# Patient Record
Sex: Female | Born: 1970 | Race: White | Hispanic: No | Smoking: Never smoker
Health system: Southern US, Community
[De-identification: ages and names within clinical notes are randomized; demographics above are authoritative.]

## PROBLEM LIST (undated history)

## (undated) DIAGNOSIS — J45909 Unspecified asthma, uncomplicated: Secondary | ICD-10-CM

## (undated) DIAGNOSIS — J449 Chronic obstructive pulmonary disease, unspecified: Secondary | ICD-10-CM

## (undated) DIAGNOSIS — S8490XA Injury of unspecified nerve at lower leg level, unspecified leg, initial encounter: Secondary | ICD-10-CM

## (undated) DIAGNOSIS — I509 Heart failure, unspecified: Secondary | ICD-10-CM

## (undated) DIAGNOSIS — M069 Rheumatoid arthritis, unspecified: Secondary | ICD-10-CM

## (undated) DIAGNOSIS — M797 Fibromyalgia: Secondary | ICD-10-CM

## (undated) DIAGNOSIS — I10 Essential (primary) hypertension: Secondary | ICD-10-CM

## (undated) DIAGNOSIS — M1990 Unspecified osteoarthritis, unspecified site: Secondary | ICD-10-CM

## (undated) DIAGNOSIS — N390 Urinary tract infection, site not specified: Secondary | ICD-10-CM

## (undated) DIAGNOSIS — J189 Pneumonia, unspecified organism: Secondary | ICD-10-CM

## (undated) DIAGNOSIS — I739 Peripheral vascular disease, unspecified: Secondary | ICD-10-CM

## (undated) DIAGNOSIS — G40409 Other generalized epilepsy and epileptic syndromes, not intractable, without status epilepticus: Secondary | ICD-10-CM

## (undated) DIAGNOSIS — D369 Benign neoplasm, unspecified site: Secondary | ICD-10-CM

## (undated) DIAGNOSIS — R569 Unspecified convulsions: Secondary | ICD-10-CM

## (undated) HISTORY — PX: SHOULDER SURGERY: SHX246

## (undated) HISTORY — PX: BACK SURGERY: SHX140

## (undated) HISTORY — DX: Essential (primary) hypertension: I10

## (undated) HISTORY — PX: TONSILLECTOMY: SUR1361

## (undated) HISTORY — PX: TUBAL LIGATION: SHX77

---

## 1998-02-25 ENCOUNTER — Ambulatory Visit (HOSPITAL_COMMUNITY): Admission: RE | Admit: 1998-02-25 | Discharge: 1998-02-25 | Payer: Self-pay | Admitting: *Deleted

## 1999-07-10 ENCOUNTER — Encounter: Payer: Self-pay | Admitting: Cardiology

## 1999-07-10 ENCOUNTER — Encounter: Admission: RE | Admit: 1999-07-10 | Discharge: 1999-07-10 | Payer: Self-pay | Admitting: Cardiology

## 2001-02-15 ENCOUNTER — Emergency Department (HOSPITAL_COMMUNITY): Admission: EM | Admit: 2001-02-15 | Discharge: 2001-02-15 | Payer: Self-pay | Admitting: Emergency Medicine

## 2001-12-01 ENCOUNTER — Emergency Department (HOSPITAL_COMMUNITY): Admission: EM | Admit: 2001-12-01 | Discharge: 2001-12-01 | Payer: Self-pay | Admitting: Emergency Medicine

## 2001-12-01 ENCOUNTER — Encounter: Payer: Self-pay | Admitting: Emergency Medicine

## 2002-12-06 ENCOUNTER — Emergency Department (HOSPITAL_COMMUNITY): Admission: EM | Admit: 2002-12-06 | Discharge: 2002-12-06 | Payer: Self-pay | Admitting: Emergency Medicine

## 2002-12-06 ENCOUNTER — Encounter: Payer: Self-pay | Admitting: Emergency Medicine

## 2002-12-31 ENCOUNTER — Emergency Department (HOSPITAL_COMMUNITY): Admission: EM | Admit: 2002-12-31 | Discharge: 2002-12-31 | Payer: Self-pay | Admitting: Emergency Medicine

## 2002-12-31 ENCOUNTER — Encounter: Payer: Self-pay | Admitting: *Deleted

## 2003-01-03 ENCOUNTER — Ambulatory Visit (HOSPITAL_COMMUNITY): Admission: RE | Admit: 2003-01-03 | Discharge: 2003-01-03 | Payer: Self-pay | Admitting: *Deleted

## 2003-01-03 ENCOUNTER — Encounter: Payer: Self-pay | Admitting: *Deleted

## 2003-11-01 ENCOUNTER — Emergency Department (HOSPITAL_COMMUNITY): Admission: EM | Admit: 2003-11-01 | Discharge: 2003-11-01 | Payer: Self-pay | Admitting: Emergency Medicine

## 2004-08-02 ENCOUNTER — Emergency Department (HOSPITAL_COMMUNITY): Admission: EM | Admit: 2004-08-02 | Discharge: 2004-08-02 | Payer: Self-pay | Admitting: Emergency Medicine

## 2004-12-25 ENCOUNTER — Encounter: Admission: RE | Admit: 2004-12-25 | Discharge: 2004-12-25 | Payer: Self-pay | Admitting: Family Medicine

## 2005-06-11 ENCOUNTER — Encounter: Admission: RE | Admit: 2005-06-11 | Discharge: 2005-09-09 | Payer: Self-pay | Admitting: Orthopedic Surgery

## 2005-07-28 ENCOUNTER — Ambulatory Visit (HOSPITAL_COMMUNITY): Admission: RE | Admit: 2005-07-28 | Discharge: 2005-07-28 | Payer: Self-pay | Admitting: Orthopedic Surgery

## 2006-05-05 ENCOUNTER — Emergency Department (HOSPITAL_COMMUNITY): Admission: EM | Admit: 2006-05-05 | Discharge: 2006-05-06 | Payer: Self-pay | Admitting: Emergency Medicine

## 2006-05-06 ENCOUNTER — Observation Stay (HOSPITAL_COMMUNITY): Admission: EM | Admit: 2006-05-06 | Discharge: 2006-05-08 | Payer: Self-pay | Admitting: Emergency Medicine

## 2006-06-03 ENCOUNTER — Emergency Department (HOSPITAL_COMMUNITY): Admission: EM | Admit: 2006-06-03 | Discharge: 2006-06-03 | Payer: Self-pay | Admitting: Emergency Medicine

## 2006-06-04 ENCOUNTER — Emergency Department (HOSPITAL_COMMUNITY): Admission: EM | Admit: 2006-06-04 | Discharge: 2006-06-04 | Payer: Self-pay | Admitting: Family Medicine

## 2006-09-12 ENCOUNTER — Ambulatory Visit (HOSPITAL_COMMUNITY): Admission: RE | Admit: 2006-09-12 | Discharge: 2006-09-12 | Payer: Self-pay | Admitting: Orthopedic Surgery

## 2006-09-18 ENCOUNTER — Ambulatory Visit (HOSPITAL_COMMUNITY): Admission: RE | Admit: 2006-09-18 | Discharge: 2006-09-19 | Payer: Self-pay | Admitting: Orthopaedic Surgery

## 2007-02-27 ENCOUNTER — Ambulatory Visit: Admission: RE | Admit: 2007-02-27 | Discharge: 2007-02-27 | Payer: Self-pay | Admitting: Orthopaedic Surgery

## 2007-03-15 ENCOUNTER — Emergency Department (HOSPITAL_COMMUNITY): Admission: EM | Admit: 2007-03-15 | Discharge: 2007-03-16 | Payer: Self-pay | Admitting: Emergency Medicine

## 2007-03-17 ENCOUNTER — Inpatient Hospital Stay (HOSPITAL_COMMUNITY): Admission: RE | Admit: 2007-03-17 | Discharge: 2007-03-21 | Payer: Self-pay | Admitting: Orthopaedic Surgery

## 2008-03-25 ENCOUNTER — Emergency Department (HOSPITAL_COMMUNITY): Admission: EM | Admit: 2008-03-25 | Discharge: 2008-03-25 | Payer: Self-pay | Admitting: Emergency Medicine

## 2008-07-08 ENCOUNTER — Ambulatory Visit: Payer: Self-pay | Admitting: Family Medicine

## 2008-08-06 ENCOUNTER — Ambulatory Visit (HOSPITAL_COMMUNITY): Admission: RE | Admit: 2008-08-06 | Discharge: 2008-08-06 | Payer: Self-pay | Admitting: Unknown Physician Specialty

## 2009-01-22 ENCOUNTER — Observation Stay (HOSPITAL_COMMUNITY): Admission: EM | Admit: 2009-01-22 | Discharge: 2009-01-22 | Payer: Self-pay | Admitting: Emergency Medicine

## 2009-03-17 ENCOUNTER — Emergency Department (HOSPITAL_COMMUNITY): Admission: EM | Admit: 2009-03-17 | Discharge: 2009-03-17 | Payer: Self-pay | Admitting: Family Medicine

## 2009-05-03 ENCOUNTER — Emergency Department (HOSPITAL_COMMUNITY): Admission: EM | Admit: 2009-05-03 | Discharge: 2009-05-03 | Payer: Self-pay | Admitting: Emergency Medicine

## 2009-05-31 ENCOUNTER — Ambulatory Visit: Payer: Self-pay | Admitting: Family Medicine

## 2009-05-31 LAB — CONVERTED CEMR LAB
ALT: 19 units/L (ref 0–35)
AST: 19 units/L (ref 0–37)
Albumin: 4.1 g/dL (ref 3.5–5.2)
Alkaline Phosphatase: 55 units/L (ref 39–117)
Anti Nuclear Antibody(ANA): NEGATIVE
BUN: 8 mg/dL (ref 6–23)
Basophils Absolute: 0 10*3/uL (ref 0.0–0.1)
Basophils Relative: 0 % (ref 0–1)
CO2: 24 meq/L (ref 19–32)
Calcium: 9.3 mg/dL (ref 8.4–10.5)
Chloride: 106 meq/L (ref 96–112)
Creatinine, Ser: 0.76 mg/dL (ref 0.40–1.20)
Eosinophils Absolute: 0.1 10*3/uL (ref 0.0–0.7)
Eosinophils Relative: 2 % (ref 0–5)
Free T4: 1.11 ng/dL (ref 0.80–1.80)
Glucose, Bld: 97 mg/dL (ref 70–99)
HCT: 39.1 % (ref 36.0–46.0)
Hemoglobin: 12.1 g/dL (ref 12.0–15.0)
Lymphocytes Relative: 33 % (ref 12–46)
Lymphs Abs: 2.4 10*3/uL (ref 0.7–4.0)
MCHC: 30.9 g/dL (ref 30.0–36.0)
MCV: 85.4 fL (ref 78.0–100.0)
Monocytes Absolute: 0.7 10*3/uL (ref 0.1–1.0)
Monocytes Relative: 10 % (ref 3–12)
Neutro Abs: 4.1 10*3/uL (ref 1.7–7.7)
Neutrophils Relative %: 56 % (ref 43–77)
Platelets: 327 10*3/uL (ref 150–400)
Potassium: 4.1 meq/L (ref 3.5–5.3)
RBC: 4.58 M/uL (ref 3.87–5.11)
RDW: 14.1 % (ref 11.5–15.5)
Rhuematoid fact SerPl-aCnc: <20 intl units/mL (ref 0–20)
Sed Rate: 29 mm/hr — ABNORMAL HIGH (ref 0–22)
Sodium: 140 meq/L (ref 135–145)
TSH: 1.482 microintl units/mL (ref 0.350–4.500)
Total Bilirubin: 0.8 mg/dL (ref 0.3–1.2)
Total Protein: 7.2 g/dL (ref 6.0–8.3)
Vit D, 25-Hydroxy: 13 ng/mL — ABNORMAL LOW (ref 30–89)
WBC: 7.3 10*3/uL (ref 4.0–10.5)

## 2009-06-05 ENCOUNTER — Emergency Department (HOSPITAL_COMMUNITY): Admission: EM | Admit: 2009-06-05 | Discharge: 2009-06-05 | Payer: Self-pay | Admitting: Emergency Medicine

## 2009-11-28 ENCOUNTER — Emergency Department (HOSPITAL_COMMUNITY)
Admission: EM | Admit: 2009-11-28 | Discharge: 2009-11-28 | Payer: Self-pay | Source: Home / Self Care | Admitting: Emergency Medicine

## 2010-03-28 ENCOUNTER — Ambulatory Visit (HOSPITAL_COMMUNITY)
Admission: RE | Admit: 2010-03-28 | Discharge: 2010-03-28 | Payer: Self-pay | Source: Home / Self Care | Attending: Family Medicine | Admitting: Family Medicine

## 2010-06-01 LAB — URINALYSIS, ROUTINE W REFLEX MICROSCOPIC
Bilirubin Urine: NEGATIVE
Glucose, UA: NEGATIVE mg/dL
Hgb urine dipstick: NEGATIVE
Ketones, ur: NEGATIVE mg/dL
Nitrite: NEGATIVE
Protein, ur: NEGATIVE mg/dL
Specific Gravity, Urine: 1.01 (ref 1.005–1.030)
Urobilinogen, UA: 1 mg/dL (ref 0.0–1.0)
pH: 7.5 (ref 5.0–8.0)

## 2010-06-01 LAB — POCT PREGNANCY, URINE: Preg Test, Ur: NEGATIVE

## 2010-06-07 LAB — URINALYSIS, ROUTINE W REFLEX MICROSCOPIC
Bilirubin Urine: NEGATIVE
Glucose, UA: NEGATIVE mg/dL
Hgb urine dipstick: NEGATIVE
Ketones, ur: NEGATIVE mg/dL
Nitrite: NEGATIVE
Protein, ur: NEGATIVE mg/dL
Specific Gravity, Urine: 1.008 (ref 1.005–1.030)
Urobilinogen, UA: 0.2 mg/dL (ref 0.0–1.0)
pH: 8.5 — ABNORMAL HIGH (ref 5.0–8.0)

## 2010-06-07 LAB — RAPID URINE DRUG SCREEN, HOSP PERFORMED
Amphetamines: NOT DETECTED
Barbiturates: NOT DETECTED
Benzodiazepines: NOT DETECTED
Cocaine: NOT DETECTED
Opiates: NOT DETECTED
Tetrahydrocannabinol: NOT DETECTED

## 2010-06-07 LAB — DIFFERENTIAL
Basophils Absolute: 0.2 10*3/uL — ABNORMAL HIGH (ref 0.0–0.1)
Basophils Relative: 2 % — ABNORMAL HIGH (ref 0–1)
Eosinophils Absolute: 0 10*3/uL (ref 0.0–0.7)
Eosinophils Relative: 0 % (ref 0–5)
Lymphocytes Relative: 18 % (ref 12–46)
Lymphs Abs: 1.8 10*3/uL (ref 0.7–4.0)
Monocytes Absolute: 0.5 10*3/uL (ref 0.1–1.0)
Monocytes Relative: 5 % (ref 3–12)
Neutro Abs: 7.6 10*3/uL (ref 1.7–7.7)
Neutrophils Relative %: 75 % (ref 43–77)

## 2010-06-07 LAB — ETHANOL: Alcohol, Ethyl (B): <5 mg/dL (ref 0–10)

## 2010-06-07 LAB — D-DIMER, QUANTITATIVE: D-Dimer, Quant: <0.22 ug/mL-FEU (ref 0.00–0.48)

## 2010-06-07 LAB — CBC
HCT: 36.7 % (ref 36.0–46.0)
Hemoglobin: 12.6 g/dL (ref 12.0–15.0)
MCHC: 34.4 g/dL (ref 30.0–36.0)
MCV: 82.5 fL (ref 78.0–100.0)
Platelets: 275 10*3/uL (ref 150–400)
RBC: 4.45 MIL/uL (ref 3.87–5.11)
RDW: 13.6 % (ref 11.5–15.5)
WBC: 10.1 10*3/uL (ref 4.0–10.5)

## 2010-06-07 LAB — PREGNANCY, URINE: Preg Test, Ur: NEGATIVE

## 2010-06-11 LAB — CBC
HCT: 38 % (ref 36.0–46.0)
Hemoglobin: 12.6 g/dL (ref 12.0–15.0)
MCHC: 33.1 g/dL (ref 30.0–36.0)
MCV: 82.9 fL (ref 78.0–100.0)
Platelets: 331 10*3/uL (ref 150–400)
RBC: 4.59 MIL/uL (ref 3.87–5.11)
RDW: 13.6 % (ref 11.5–15.5)
WBC: 9 10*3/uL (ref 4.0–10.5)

## 2010-06-11 LAB — BASIC METABOLIC PANEL
BUN: 8 mg/dL (ref 6–23)
CO2: 23 mEq/L (ref 19–32)
Calcium: 9.5 mg/dL (ref 8.4–10.5)
Chloride: 109 mEq/L (ref 96–112)
Creatinine, Ser: 0.77 mg/dL (ref 0.4–1.2)
GFR calc Af Amer: >60 mL/min (ref >60)
GFR calc non Af Amer: >60 mL/min (ref >60)
Glucose, Bld: 122 mg/dL — ABNORMAL HIGH (ref 70–99)
Potassium: 3.6 mEq/L (ref 3.5–5.1)
Sodium: 139 mEq/L (ref 135–145)

## 2010-06-11 LAB — D-DIMER, QUANTITATIVE: D-Dimer, Quant: 0.28 ug/mL-FEU (ref 0.00–0.48)

## 2010-06-21 LAB — URINALYSIS, ROUTINE W REFLEX MICROSCOPIC
Bilirubin Urine: NEGATIVE
Glucose, UA: NEGATIVE mg/dL
Hgb urine dipstick: NEGATIVE
Ketones, ur: NEGATIVE mg/dL
Nitrite: NEGATIVE
Protein, ur: NEGATIVE mg/dL
Specific Gravity, Urine: 1.005 (ref 1.005–1.030)
Urobilinogen, UA: 0.2 mg/dL (ref 0.0–1.0)
pH: 7.5 (ref 5.0–8.0)

## 2010-06-21 LAB — DIFFERENTIAL
Basophils Absolute: 0 10*3/uL (ref 0.0–0.1)
Basophils Relative: 0 % (ref 0–1)
Eosinophils Absolute: 0 10*3/uL (ref 0.0–0.7)
Eosinophils Relative: 0 % (ref 0–5)
Lymphocytes Relative: 7 % — ABNORMAL LOW (ref 12–46)
Lymphs Abs: 0.7 10*3/uL (ref 0.7–4.0)
Monocytes Absolute: 0.5 10*3/uL (ref 0.1–1.0)
Monocytes Relative: 4 % (ref 3–12)
Neutro Abs: 10.1 10*3/uL — ABNORMAL HIGH (ref 1.7–7.7)
Neutrophils Relative %: 88 % — ABNORMAL HIGH (ref 43–77)

## 2010-06-21 LAB — GLUCOSE, CAPILLARY: Glucose-Capillary: 148 mg/dL — ABNORMAL HIGH (ref 70–99)

## 2010-06-21 LAB — POCT I-STAT, CHEM 8
BUN: 7 mg/dL (ref 6–23)
Calcium, Ion: 1.18 mmol/L (ref 1.12–1.32)
Chloride: 107 mEq/L (ref 96–112)
Creatinine, Ser: 0.8 mg/dL (ref 0.4–1.2)
Glucose, Bld: 151 mg/dL — ABNORMAL HIGH (ref 70–99)
HCT: 42 % (ref 36.0–46.0)
Hemoglobin: 14.3 g/dL (ref 12.0–15.0)
Potassium: 4 mEq/L (ref 3.5–5.1)
Sodium: 140 mEq/L (ref 135–145)
TCO2: 19 mmol/L (ref 0–100)

## 2010-06-21 LAB — URINE MICROSCOPIC-ADD ON

## 2010-06-21 LAB — CBC
HCT: 39.5 % (ref 36.0–46.0)
Hemoglobin: 13.7 g/dL (ref 12.0–15.0)
MCHC: 34.6 g/dL (ref 30.0–36.0)
MCV: 82.6 fL (ref 78.0–100.0)
Platelets: 246 10*3/uL (ref 150–400)
RBC: 4.78 MIL/uL (ref 3.87–5.11)
RDW: 13.5 % (ref 11.5–15.5)
WBC: 11.4 10*3/uL — ABNORMAL HIGH (ref 4.0–10.5)

## 2010-06-21 LAB — POCT PREGNANCY, URINE: Preg Test, Ur: NEGATIVE

## 2010-06-21 LAB — D-DIMER, QUANTITATIVE: D-Dimer, Quant: 0.51 ug/mL-FEU — ABNORMAL HIGH (ref 0.00–0.48)

## 2010-07-03 LAB — DIFFERENTIAL
Basophils Absolute: 0 10*3/uL (ref 0.0–0.1)
Basophils Relative: 0 % (ref 0–1)
Eosinophils Absolute: 0 10*3/uL (ref 0.0–0.7)
Eosinophils Relative: 0 % (ref 0–5)
Lymphocytes Relative: 5 % — ABNORMAL LOW (ref 12–46)
Lymphs Abs: 0.8 10*3/uL (ref 0.7–4.0)
Monocytes Absolute: 0.7 10*3/uL (ref 0.1–1.0)
Monocytes Relative: 5 % (ref 3–12)
Neutro Abs: 13.1 10*3/uL — ABNORMAL HIGH (ref 1.7–7.7)
Neutrophils Relative %: 89 % — ABNORMAL HIGH (ref 43–77)

## 2010-07-03 LAB — POCT I-STAT, CHEM 8
BUN: 8 mg/dL (ref 6–23)
Calcium, Ion: 1.16 mmol/L (ref 1.12–1.32)
Chloride: 109 mEq/L (ref 96–112)
Creatinine, Ser: 0.8 mg/dL (ref 0.4–1.2)
Glucose, Bld: 115 mg/dL — ABNORMAL HIGH (ref 70–99)
HCT: 39 % (ref 36.0–46.0)
Hemoglobin: 13.3 g/dL (ref 12.0–15.0)
Potassium: 3.7 mEq/L (ref 3.5–5.1)
Sodium: 141 mEq/L (ref 135–145)
TCO2: 20 mmol/L (ref 0–100)

## 2010-07-03 LAB — URINALYSIS, ROUTINE W REFLEX MICROSCOPIC
Bilirubin Urine: NEGATIVE
Glucose, UA: NEGATIVE mg/dL
Hgb urine dipstick: NEGATIVE
Ketones, ur: NEGATIVE mg/dL
Nitrite: NEGATIVE
Protein, ur: NEGATIVE mg/dL
Specific Gravity, Urine: 1.012 (ref 1.005–1.030)
Urobilinogen, UA: 0.2 mg/dL (ref 0.0–1.0)
pH: 7.5 (ref 5.0–8.0)

## 2010-07-03 LAB — URINE CULTURE: Colony Count: >=100000

## 2010-07-03 LAB — CBC
HCT: 37.3 % (ref 36.0–46.0)
Hemoglobin: 12.5 g/dL (ref 12.0–15.0)
MCHC: 33.6 g/dL (ref 30.0–36.0)
MCV: 82.4 fL (ref 78.0–100.0)
Platelets: 282 10*3/uL (ref 150–400)
RBC: 4.52 MIL/uL (ref 3.87–5.11)
RDW: 12.9 % (ref 11.5–15.5)
WBC: 14.7 10*3/uL — ABNORMAL HIGH (ref 4.0–10.5)

## 2010-07-03 LAB — LIPASE, BLOOD: Lipase: 19 U/L (ref 11–59)

## 2010-07-03 LAB — POCT PREGNANCY, URINE: Preg Test, Ur: NEGATIVE

## 2010-08-01 NOTE — Op Note (Signed)
Suzanne Velazquez, Suzanne Velazquez             ACCOUNT NO.:  [id number]   MEDICAL RECORD NO.:  [id number]          PATIENT TYPE:  EMS   LOCATION:  MAJO                         FACILITY:  [facility]   PHYSICIAN:  Mark C. Ophelia Charter, M.D.    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   OPERATION/PROCEDURE:  1. Left L4-5 TLIF bilateral foraminotomy.  2. Redo microdiskectomy.  3. Peek plus local bone.  4. Pedicle bone marrow aspirate.  5. Bilateral transverse process fusion.  6. pedicle instrumentation, rods and screws,  L4-5   ESTIMATED BLOOD LOSS:  300 mL.  Retransfuse 100 mL.   PROCEDURE:  After induction of general anesthesia,  orotracheal  intubation, the patient placed prone on chest rolls.  Back was prepped  and preoperative Ancef was given.  CellSaver was used.   The back was prepped with DuraPrep, area squared with towels, Betadine  Vi-Drape applied and the usual laminectomy sheets and drapes.  Midline  incision was made using the old scar, extending up proximally and  distally where a sterile skin marker been used to mark the midline.  Fascia was divided.  She had cerebellar retractors placed.  All the  adipose tissue apart until the fascia was divided with subperiosteal  dissection out to the facets and then out to the transverse processes at  4 and 5.  Once the transfer processes were prepared using the Bovie  electrocautery, soft tissue removed from the gutters. A Kocher clamp was  placed directly down over the lamina, confirming that this was directly  over the L4-5 space.  Decompression was performed on both sides and  operative microscope was draped and  brought in for microdissection.  The  patient is having more left pain than right.  Had previous disk  herniation on the right at L4-5 and soft tissue was taken down that was  adherent to the dura with microdissection technique.  Bone was removed  out to the level of the pedicle on the left side. Nerve root was gently  mobilized.  Annulus was incised.  Passes were made, removing the disk  material with some large chunks centrally.  Once this was identified,  facet was removed and bone was removed directly out lateral from level  of the disk, just above the pedicle at L5 and the nerve root at L4 was  carefully retracted.  Some small cupping veins were coagulated with the  fine insufflated by bipolar cautery.  Passes were made with straight  pituitaries, angled curettes, straight curettes, the blade cutter, and  box cutter.  A 7 trial followed by 9 was tried and the 9 gave a nice  fit.  The 11 box had been used instead of the 9, initially going in and  as the cage was inserted after bone had been meticulously cleaned  out,  small pieces were spaced anterior to where the cage was sitting for the  interbody fusion.  Cage was inserted.  It was hanging out but would not  advance.  It was removed.  There was some epidural bleeding below the  nerve root.  Again this was stopped, bringing the operative microscope  back in and microscope taken and then cage reinserted.  Again it was not  quite satisfactory position, would not continue to advance and turn.  So  it was taken out again and ring curette was used on the opposite side.  Foramina was enlarged further.  Dura was freed up in the lateral gutter  so it would mobilize easier and then Epstein curette was used and the  midline cage was inserted through the Viper retractor far lateral as  possible and with this initial insertion site starting a little bit more  medial with the dura gently mobilized, the cage this time kicked over as  it should and  was in a transverse position. On AP view it was at the  midline.  Next, the screws were placed with the sequence of starter awl,  the joystick, pedicle feeler, tap, aspiration of the pedicle for bone  marrow using the dull trocar, and taking aspirated bone marrow from the  pedicle and placing on the VITOSS that was then cut into strips on one-  half and left as a __________ on the other side.  On the right L4  pedicle screw, it angled up a little bit.  Did not violate the endplate.  Looked like it started in the middle of the pedicle and it was backed  out and then repositioned. On the second try.  It was placed in good  position parallel with the other screw with good purchase.  All  remaining pieces of bone were then repaired with the VITOSS strips  placed in the lateral gutters.  VITOSS on the TLIF on the left, the  strips and chips of the patient's own bone on the right side after the  transverse processes were burred up with power bur prior to placement of  each pedicle screw.  The 40 mm rods were placed.  All screws were Biomet  Polaris 40 mm length, 6.5 mm diameter screws with 40 mm titanium rods.  Left side was tightened down first, compressed to 105 pounds with the  torque wrench and then the opposite side on the right compressed, locked  down 1-2 mm sticking out on each end of the screws.  A pickup was placed  on the right side of the patient and AP and lateral final spot pictures  were taken showing good position of screws in the pedicle, good position  of the rods and good position of the cage with bilateral bone graft.  The patient was then closed with 0 Vicryl in deep fascia, 2-0 Vicryl on  the subcutaneous tissue, skin closure, postop dressing.  Hemovac was  placed in the subcutaneous tissue.  The patient was retransfused 100 mL  from the CellSaver.  Instrument and needle count, pack count correct.  The TLIF  area and the cage and gutter and the dura was carefully  inspected  prior to closure, making sure no bone graft was sitting in  that position.  The nerve root was free, both L4-L5, and hockey stick  was used to check each nerve root, right and left, and make sure they  were all completely decompressed prior to the instrumentation closure.  The patient was transferred to the recovery room in stable condition.      Mark C. Ophelia Charter, M.D.  Electronically Signed     MCY/MEDQ  D:  03/17/2007  T:  03/18/2007  Job:  [id number]

## 2010-08-01 NOTE — Op Note (Signed)
NAMETALIBAH, COLASURDO             ACCOUNT NO.:  [id number]   MEDICAL RECORD NO.:  [id number]          PATIENT TYPE:  OIB   LOCATION:  5032                         FACILITY:  [facility]   PHYSICIAN:  Mark C. Ophelia Charter, M.D.    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   PROCEDURE:  Right L4-5 microdiskectomy.   SURGEON:  Mark C. Ophelia Charter, M.D.   ASSISTANT:  Wende Neighbors, P.A.-C.   ANESTHESIA:  Plus Marcaine, skin, local.   BLOOD LOSS:  Minimal.   PROCEDURE:  After induction of general anesthesia, the patient was  placed in theAndrews frame with careful padding and positioning.  The  back was prepped with DuraPrep, the area was supported with towels.  Betadine and Vi-Drape was applied.  Laminectomy sheets and drapes were  applied after a sterile skin marker was used to mark placement of the  drapes, where a spinal needle was placed. Cross-table lateral x-ray  showed the needle was just above the 4-5 space.  Incision started at the  level of the needle, extended distally.  Subperiosteal dissection on the  lamina, Taylor retractors placed laterally.  Laminotomy was performed  and Penfield #4 was placed down next to the ligamentum. A second x-ray  confirmed it was at the appropriate level.  Operative microscope was  draped and brought in.  Ligamentum was removed.  Nerve root was dorsally  displaced, and as it was gently teased over there was a large disk  rupture present.  It was sending out pressure from the __________ .  Large chunks of disks were delivered.  Straight-down pituitaries,  micropituitaries were used to perform diskectomy, removing the  fragments.  Foraminotomy was performed.  Nerve root was free.  After  irrigation with saline solution, passes were made anteriorly over the  dura with a hockey stick.  No areas of compression.  Bone was removed  out to the level of the pedicle.  There was mild facet overhang in this  40 year old female.  Nerve root was free.  Dura was then tacked and  Taylor retractor was removed.  Deep fascia was closed with 0 Vicryl, 2-0  in the subcutaneous tissue, 4-0 Vicryl subcuticular, skin closed with  tincture of benzoin and Steri-Strips, postop dressing and tape.  Instrument count and needle count was correct.      Mark C. Ophelia Charter, M.D.  Electronically Signed     MCY/MEDQ  D:  09/18/2006  T:  09/19/2006  Job:  [id number]

## 2010-08-04 NOTE — Discharge Summary (Signed)
Suzanne Velazquez, Suzanne Velazquez             ACCOUNT NO.:  [id number]   MEDICAL RECORD NO.:  [id number]          PATIENT TYPE:  OBV   LOCATION:  4705                         FACILITY:  [facility]   PHYSICIAN:  Mobolaji B. Bakare, M.D.DATE OF BIRTH:  [birth year]   DATE OF ADMISSION:  05/06/2006  DATE OF DISCHARGE:  05/08/2006                               DISCHARGE SUMMARY   PRIMARY CARE PHYSICIAN:  Dr. Candyce Churn. Sanders.   NEUROLOGIST:  Dr. Laural Benes.   FINAL DIAGNOSES:  1. Pyelonephritis.  2. Probable seizure activity.  3. Syncope.   PROCEDURE:  Lumbar spine x-ray showed mild degenerative changes of L4-5  and L5-S1. No evidence of infection. Chest x-ray:  No acute  cardiopulmonary disease. A CT scan showed no intracranial abnormalities.   BRIEF HISTORY:  Please refer to admission H&P dictated by Dr. Hannah Beat for full details.   HOSPITAL COURSE:  1. Syncope versus seizures. The patient could not give adequate      history of what happened. However, as per daughter's history, the      patient passed out while on return from the bathroom. There was no      tonic/clonic convulsion. It is not clear if she had a nonconvulsive      seizure. She had a low blood pressure on arrival to the emergency      room. She was syncopal. This was felt to be orthostatic. The      patient has adequately hydrated. She is no longer syncopal or      orthostatic. The patient will be seeing a neurologist within the      next one week. She was continued on home medications.  2. Acute pyelonephritis. She had a positive UA. The patient was      empirically started on ciprofloxacin. Urine culture came back      __________, nevertheless, given the history of increased urinary      frequency and low-grade fever, she would complete treatment for      pyelonephritis.   DISCHARGE CONDITION:  Stable.   VITALS ON DISCHARGE:  Temperature 98.1, blood pressure 119/75, O2  saturations of 95%, respiratory rate of 18, heart rate  of 79 with a  temperature of 97.8.   DISCHARGE MEDICATIONS:  1. Ciprofloxacin 500 mg daily until April 27, 2006.  2. Topamax 75 mg b.i.d.  3. Fluoxetine 20 mg daily.  4. Lithium continue as before.   FOLLOW UP:  With Dr. Allyne Gee in 1 to 2 weeks and Dr. Laural Benes,  neurologist, in 1 week.      Mobolaji B. Corky Downs, M.D.  Electronically Signed     MBB/MEDQ  D:  05/08/2006  T:  05/08/2006  Job:  [id number]

## 2010-08-04 NOTE — H&P (Signed)
Suzanne Velazquez, NIEHOFF NO.:  [id number]   MEDICAL RECORD NO.:  [id number]          PATIENT TYPE:  INP   LOCATION:  4705                         FACILITY:  [facility]   PHYSICIAN:  Hettie Holstein, D.O.    DATE OF BIRTH:  Jun 27, 1970   DATE OF ADMISSION:  05/06/2006  DATE OF DISCHARGE:                              HISTORY & PHYSICAL   PRIMARY CARE PHYSICIAN:  Unassigned.   CHIEF COMPLAINT:  Passed out.   HISTORY OF PRESENTING ILLNESS:  Suzanne Velazquez is a 40 year old female  with history of petit mal seizures managed by neurologist in [facility] as well as history of bipolar disorder managed by Dr.  Allyne Gee at [facility], who had been in her usual state of  health up until the 17th, when her daughter reports that she observed  her mother going to the bathroom, and as she was coming back, she called  her daughter's name out 5 times and stood stationary staring off into  space, and as she ran up to her, she collapsed to the ground into her  daughter's arms.  There was no loss of bowel or bladder.  She did not  exhibit tonic-clonic movements, and she was transported to the emergency  department via private vehicle.  She was noticed to have low blood  pressure 79/40 coming in.  In addition, she had evidence of pyonephritis  on her urine studies.   Suzanne Velazquez is quite a difficult historian with respect to her  medication dosages, and they called [facility] on [address] where she  states she has had her prescriptions filled.  These do not exactly  reconcile.  [name] history does seem to change a little bit,  specifically in reference to her medication dosages and frequencies.  She has not seen her neurologist for quite some time.  She states that  she missed her last appointment and ran out of her Topamax about 4 days  ago.  She states that she recently started lithium about 3 weeks ago;  however, in discussion with the  pharmacist, this was filled in December  of 2007.  In any event, levels are pending at this time.   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  She had formerly followed with Dr. Orlin Hilding.  However, she sees a  neurologist in [facility] now.  She has a history of seizure disorder,  which as described seems to be petit mal seizures.  She does not drive.  She was seen in the emergency department in August of 2005 as well as  after that with similar complaints of dizziness and possible seizures.   MEDICATIONS:  I have called the [facility] on [address] to attempt to  reconcile this.  They have her on:  1. Topamax filled about a year ago at 75 mg p.o. b.i.d.  2. Lithium carbonate 300 q.a.m. 600 q.h.s.; however, [name]      states that she only takes this once a day and has only been on      this  for 3 weeks.  This was filled once again a year ago by Dr.      Allyne Gee.  3. Fluoxetine 20 mg daily.   ALLERGIES:  She is allergic to CODEINE.   SURGICAL HISTORY:  She had a tubal ligation.  She had right shoulder  surgery as well as tonsil surgery.  She has had tonsillectomy and  adenoidectomy.   REVIEW OF SYSTEMS:  She states that she has felt feverish lately.  She  has had no weight changes, no nausea, vomiting or diarrhea.  No blood in  her stools.  First day of her last menstrual period was February 21.  Further review of systems is unremarkable.   PHYSICAL EXAMINATION:  VITAL SIGNS:  In the emergency department, blood  pressure was 79/48.  She did receive some IV fluids in the emergency  department and is improved.  However, prior to discharge, she became  orthostatic with systolic of 95 and diastolic 64 and a heart rate of 99.  Her temperature in the department was 97.7.  GENERAL:  The patient is alert and oriented, no signs of extremis, is  nontoxic in appearance.  HEENT:  Reveals her head to be normocephalic, atraumatic.  Extraocular  muscles  are intact.  NECK:  Supple and nontender, no palpable thyromegaly or mass.  CARDIOVASCULAR EXAM:  Reveals normal S1 and S2.  LUNGS:  Clear to auscultation bilaterally.  There is normal effort.  There is no dullness to percussion.  ABDOMEN:  Soft and nontender.  EXTREMITIES:  Lower extremities reveal no edema.  There is no calf  tenderness.  NEUROLOGICAL EXAM:  Reveals the patient to have quite a flat affect.  She does appear euthymic.  All 4 extremities moves spontaneously without  motor or sensory deficits.   LABORATORY DATA:  Her urine showed 21 to 50 WBCs.  Urine specific  gravity is 1.032.  Lithium was less than 0.25.  WBC 9.1, hemoglobin  27.2, platelets 75, MCV of 80.4.  Sodium 135, potassium 3.6, BUN of 4,  creatinine 0.6.  glucose of 130.  Chest x-ray was unremarkable.  CT of  the head was also unremarkable.   SOCIAL HISTORY:  As above.  The patient denies tobacco.  She denies  alcohol.  She lives at home.  She is separated.  She has 4 children.  Her daughter, Aundra Millet, can be reached at [phone].  She does not work.   FAMILY HISTORY:  Mother is 72, father is in his 23s as well.  She denies  any known medical illnesses in her parents.   ASSESSMENT:  1. Syncope.  2. Pyonephritis.  3. Seizure disorder.  4. Obesity.  5. Bipolar disorder.  6. Medical noncompliance.  7. Hypertension.   PLAN:  At this time, we will admit Suzanne Velazquez for observation and  treatment with antibiotics.  We will send her urine for culture and  administer IV fluids.  Follow her I's and O's, as sometimes lithium can  cause nephrogenic diabetes insipidus perhaps may be contributing to some  degree to dehydration.  We will attempt to reconcile her medications as  much as possible.  I do not feel that she will benefit from home health  to assure compliance with her medications, and she will need close followup with her neurologist to titrate her medications for adequate  seizure  control.  Her petit  mal seizures are very sporadic and are likely going  to be a challenge to eradicate completely.  I suspect that  her syncope  is multifactorial with her pyelonephritis as well as lower seizure  threshold as a result and medical noncompliance.      Hettie Holstein, D.O.  Electronically Signed     ESS/MEDQ  D:  05/06/2006  T:  05/06/2006  Job:  [id number]   cc:   Candyce Churn. Allyne Gee, M.D.  [facility]

## 2010-08-04 NOTE — Discharge Summary (Signed)
[name], Suzanne Velazquez             ACCOUNT NO.:  [id number]   MEDICAL RECORD NO.:  [id number]          PATIENT TYPE:  INP   LOCATION:  5023                         FACILITY:  [facility]   PHYSICIAN:  Mark C. Ophelia Charter, M.D.    DATE OF BIRTH:  January 07, 1971   DATE OF ADMISSION:  03/17/2007  DATE OF DISCHARGE:  03/21/2007                               DISCHARGE SUMMARY   FINAL DIAGNOSIS:  1. Recurrent central herniated nucleus pulposis, L4-5 with biforaminal      stenosis.  2. Anemia secondary to acute blood loss.   PROCEDURE:  1. Repeat decompression, L4-5,  2. T-lift, left 4-5.  3. Posterolateral fusion, L4-5 with pedicle screws, rods, __________,      pedicle aspirate, and PEEK cage.   A 39 year old female has had chronic low back pain, status post HNP in  July 2008, now with recurrent HNP with biforaminal narrowing.  She has  had previous rotator cuff surgery, tubal ligation, history of seizure  disorder, migraines.  She had positive straight leg raising, trace  weakness, EHL anterior tib.   After informed consent, the patient was admitted and underwent single  level 360 fusion with pedicle instrumentation, pedicle aspirate,  bilateral gutter fusion.  Postoperatively hemoglobin was 9.  She was  transfused 1 unit for her anemia secondary to acute blood loss.   PCA was discontinued.  She was ambulatory, had good relief of preop  pain.  Hemoglobin after transfusion was 10.0 and stable.  She was  discharged on Robaxin, iron, Colace, OxyContin, and Tylox for  breakthrough pain.  She had no weakness postop.  Incision was dry.  She  had a Hemovac that was removed on postop day 1.  Office follow up in 1  week.   CONDITION ON DISCHARGE:  Improved.      Mark C. Ophelia Charter, M.D.  Electronically Signed     MCY/MEDQ  D:  04/19/2007  T:  04/19/2007  Job:  [id number]

## 2010-10-11 ENCOUNTER — Encounter: Payer: Self-pay | Admitting: Cardiology

## 2010-10-13 ENCOUNTER — Encounter: Payer: Self-pay | Admitting: *Deleted

## 2010-10-16 ENCOUNTER — Encounter: Payer: Self-pay | Admitting: Cardiology

## 2010-10-16 ENCOUNTER — Ambulatory Visit (INDEPENDENT_AMBULATORY_CARE_PROVIDER_SITE_OTHER): Payer: Self-pay | Admitting: Cardiology

## 2010-10-16 VITALS — BP 110/76 | HR 64 | Resp 14 | Ht 64.0 in | Wt 180.0 lb

## 2010-10-16 DIAGNOSIS — R072 Precordial pain: Secondary | ICD-10-CM

## 2010-10-16 DIAGNOSIS — R0609 Other forms of dyspnea: Secondary | ICD-10-CM

## 2010-10-16 DIAGNOSIS — R002 Palpitations: Secondary | ICD-10-CM | POA: Insufficient documentation

## 2010-10-16 DIAGNOSIS — I10 Essential (primary) hypertension: Secondary | ICD-10-CM | POA: Insufficient documentation

## 2010-10-16 DIAGNOSIS — R079 Chest pain, unspecified: Secondary | ICD-10-CM

## 2010-10-16 DIAGNOSIS — R0600 Dyspnea, unspecified: Secondary | ICD-10-CM

## 2010-10-16 DIAGNOSIS — R0989 Other specified symptoms and signs involving the circulatory and respiratory systems: Secondary | ICD-10-CM

## 2010-10-16 NOTE — Assessment & Plan Note (Signed)
Not volume overloaded on examination. Echocardiogram will also quantify LV function.

## 2010-10-16 NOTE — Assessment & Plan Note (Signed)
Blood pressure controlled. Continue present medications. 

## 2010-10-16 NOTE — Patient Instructions (Signed)
Your physician has requested that you have a stress echocardiogram. For further information please visit [email or web address]. Please follow instruction sheet as given.   

## 2010-10-16 NOTE — Progress Notes (Signed)
HPI: 40 yo female with no prior cardiac history for evaluation of palpitations. Potassium, Hgb, LFTs and TSH normal in July of 2012. Patient has had intermittent chest pain for approximately 6 years. It is medial to the left breast. Points at the area with one finger. It can occur either with exertion or at rest. There is diaphoresis and shortness of breath. There is no nausea or vomiting. The pain can last from minutes to hours. She also describes dyspnea. It has been present for the same amount of time. She has dyspnea on exertion but states she always has the sensation of dyspnea. Also with occasional brief flutter. No syncope. Because of the above we were asked to further evaluate.  Current Outpatient Prescriptions  Medication Sig Dispense Refill  . amitriptyline (ELAVIL) 25 MG tablet Take 25 mg by mouth at bedtime.        . levETIRAcetam (KEPPRA) 500 MG tablet 2 tabs po qd       . pantoprazole (PROTONIX) 40 MG tablet Take 40 mg by mouth daily.        . propranolol (INDERAL) 80 MG tablet Take 80 mg by mouth daily.          Allergies  Allergen Reactions  . Codeine   . Magnesium-Containing Compounds     Past Medical History  Diagnosis Date  . Hypertension     Past Surgical History  Procedure Date  . Tonsillectomy   . Back surgery   . Shoulder surgery     History   Social History  . Marital Status: Legally Separated    Spouse Name: N/A    Number of Children: 4  . Years of Education: N/A   Occupational History  . UNEMPLOYED    Social History Main Topics  . Smoking status: Never Smoker   . Smokeless tobacco: Not on file  . Alcohol Use: No  . Drug Use: Not on file  . Sexually Active: Not on file   Other Topics Concern  . Not on file   Social History Narrative  . No narrative on file    Family History  Problem Relation Age of Onset  . Heart disease Father     CAD at age 67  . Heart disease Daughter     MVP    ROS: Occasional fevers and back pain but or  chills, productive cough, hemoptysis, dysphasia, odynophagia, melena, hematochezia, dysuria, hematuria, rash, seizure activity, orthopnea, PND, pedal edema, claudication. Remaining systems are negative.  Physical Exam: General:  Well developed/well nourished in NAD Skin warm/dry; previous scars on LUE from cutting Patient not depressed No peripheral clubbing Back-normal HEENT-normal/normal eyelids Neck supple/normal carotid upstroke bilaterally; no bruits; no JVD; no thyromegaly chest - CTA/ normal expansion CV - RRR/normal S1 and S2; no murmurs, rubs or gallops;  PMI nondisplaced Abdomen -NT/ND, no HSM, no mass, + bowel sounds, no bruit 2+ femoral pulses, no bruits Ext-no edema, chords, 2+ DP Neuro-grossly nonfocal  ECG 10/11/10 - Sinus rhythm at a rate of 75. Axis normal. No ST changes.

## 2010-10-16 NOTE — Assessment & Plan Note (Signed)
Symptoms atypical. Schedule stress echocardiogram. 

## 2010-10-16 NOTE — Assessment & Plan Note (Signed)
These appear to be self-limited. Continue beta blocker. Consider CardioNet in the future if symptoms worsen.

## 2010-10-27 ENCOUNTER — Ambulatory Visit (HOSPITAL_COMMUNITY): Payer: Medicaid Other | Admitting: Radiology

## 2010-10-27 ENCOUNTER — Ambulatory Visit (INDEPENDENT_AMBULATORY_CARE_PROVIDER_SITE_OTHER): Payer: Medicaid Other | Admitting: Cardiology

## 2010-10-27 DIAGNOSIS — R0989 Other specified symptoms and signs involving the circulatory and respiratory systems: Secondary | ICD-10-CM

## 2010-10-27 DIAGNOSIS — R0600 Dyspnea, unspecified: Secondary | ICD-10-CM

## 2010-10-27 DIAGNOSIS — R079 Chest pain, unspecified: Secondary | ICD-10-CM

## 2010-10-27 DIAGNOSIS — R0609 Other forms of dyspnea: Secondary | ICD-10-CM

## 2010-10-27 NOTE — Progress Notes (Signed)
Exercise Treadmill Test  Pre-Exercise Testing Evaluation Rhythm: normal sinus  Rate: 78   PR:  .17 QRS:  .07  QT:  .37 QTc: .42           Test  Exercise Tolerance Test Ordering MD: Olga Millers, MD  Interpreting MD:  Willa Rough, MD  Unique Test No: 1  Treadmill:  1  Indication for ETT: chest pain - rule out ischemia  Contraindication to ETT: No   Stress Modality: exercise - treadmill  Cardiac Imaging Performed: non   Protocol: standard Bruce - maximal  Max BP:  134/69  Max MPHR (bpm):  181 85% MPR (bpm):  154  MPHR obtained (bpm):  150 % MPHR obtained:  82  Reached 85% MPHR (min:sec):  NA Total Exercise Time (min-sec):  6:46  Workload in METS:  8.1 Borg Scale: 19  Reason ETT Terminated:  Dyspnea, lightheadedness    ST Segment Analysis At Rest  There are minor nonspecific ST-T wave changes With Exercise: There are no significant ST changes  Other Information Arrhythmia:  No Angina during ETT:  absent (0) Quality of ETT:  non-diagnostic  ETT Interpretation:  With exercise the patient developed shortness of breath and became lightheaded.  She said that she needed to stop or she might pass out.  She tolerated it well.  She was able to reach a heart rate of 150 which equals only 82% predicted maximum heart rate.  Therefore by strict criteria the study is nondiagnostic as she has not reached 85% predicted maximum heart rate.  However there is no sign of ischemia at the level of stress obtained. Comments  NA  Recommendations:  NA  Willa Rough

## 2010-10-29 ENCOUNTER — Emergency Department (HOSPITAL_COMMUNITY)
Admission: EM | Admit: 2010-10-29 | Discharge: 2010-10-29 | Disposition: A | Discharge status: Home / Self Care | Payer: Medicaid Other | Attending: Emergency Medicine | Admitting: Emergency Medicine

## 2010-10-29 DIAGNOSIS — K5289 Other specified noninfective gastroenteritis and colitis: Secondary | ICD-10-CM | POA: Insufficient documentation

## 2010-10-29 DIAGNOSIS — N390 Urinary tract infection, site not specified: Secondary | ICD-10-CM | POA: Insufficient documentation

## 2010-10-29 LAB — URINALYSIS, ROUTINE W REFLEX MICROSCOPIC
Bilirubin Urine: NEGATIVE
Glucose, UA: NEGATIVE mg/dL
Hgb urine dipstick: NEGATIVE
Ketones, ur: NEGATIVE mg/dL
Leukocytes, UA: NEGATIVE
Nitrite: NEGATIVE
Protein, ur: NEGATIVE mg/dL
Specific Gravity, Urine: 1.003 — ABNORMAL LOW (ref 1.005–1.030)
Urobilinogen, UA: 0.2 mg/dL (ref 0.0–1.0)
pH: 7.5 (ref 5.0–8.0)

## 2010-10-29 LAB — CBC
HCT: 39.4 % (ref 36.0–46.0)
Hemoglobin: 13.1 g/dL (ref 12.0–15.0)
MCH: 27.5 pg (ref 26.0–34.0)
MCHC: 33.2 g/dL (ref 30.0–36.0)
MCV: 82.8 fL (ref 78.0–100.0)
Platelets: 304 10*3/uL (ref 150–400)
RBC: 4.76 MIL/uL (ref 3.87–5.11)
RDW: 14.1 % (ref 11.5–15.5)
WBC: 12.3 10*3/uL — ABNORMAL HIGH (ref 4.0–10.5)

## 2010-10-29 LAB — BASIC METABOLIC PANEL
BUN: 7 mg/dL (ref 6–23)
CO2: 22 mEq/L (ref 19–32)
Calcium: 9.8 mg/dL (ref 8.4–10.5)
Chloride: 106 mEq/L (ref 96–112)
Creatinine, Ser: 0.69 mg/dL (ref 0.50–1.10)
GFR calc Af Amer: >60 mL/min (ref >60)
GFR calc non Af Amer: >60 mL/min (ref >60)
Glucose, Bld: 114 mg/dL — ABNORMAL HIGH (ref 70–99)
Potassium: 3.8 mEq/L (ref 3.5–5.1)
Sodium: 137 mEq/L (ref 135–145)

## 2010-10-29 LAB — PREGNANCY, URINE: Preg Test, Ur: NEGATIVE

## 2010-10-30 LAB — URINE CULTURE
Colony Count: NO GROWTH
Culture  Setup Time: 201208121641
Culture: NO GROWTH

## 2010-11-06 ENCOUNTER — Other Ambulatory Visit (HOSPITAL_COMMUNITY): Payer: Self-pay | Admitting: Family Medicine

## 2010-11-06 DIAGNOSIS — E042 Nontoxic multinodular goiter: Secondary | ICD-10-CM

## 2010-11-06 DIAGNOSIS — R1310 Dysphagia, unspecified: Secondary | ICD-10-CM

## 2010-11-08 ENCOUNTER — Emergency Department (HOSPITAL_COMMUNITY): Payer: Medicaid Other

## 2010-11-08 ENCOUNTER — Emergency Department (HOSPITAL_COMMUNITY)
Admission: EM | Admit: 2010-11-08 | Discharge: 2010-11-08 | Disposition: A | Discharge status: Home / Self Care | Payer: Medicaid Other | Attending: Emergency Medicine | Admitting: Emergency Medicine

## 2010-11-08 DIAGNOSIS — W2209XA Striking against other stationary object, initial encounter: Secondary | ICD-10-CM | POA: Insufficient documentation

## 2010-11-08 DIAGNOSIS — M79609 Pain in unspecified limb: Secondary | ICD-10-CM | POA: Insufficient documentation

## 2010-11-08 DIAGNOSIS — S60229A Contusion of unspecified hand, initial encounter: Secondary | ICD-10-CM | POA: Insufficient documentation

## 2010-11-08 DIAGNOSIS — Y92009 Unspecified place in unspecified non-institutional (private) residence as the place of occurrence of the external cause: Secondary | ICD-10-CM | POA: Insufficient documentation

## 2010-11-08 DIAGNOSIS — G40909 Epilepsy, unspecified, not intractable, without status epilepticus: Secondary | ICD-10-CM | POA: Insufficient documentation

## 2010-11-10 ENCOUNTER — Ambulatory Visit (HOSPITAL_COMMUNITY)
Admission: RE | Admit: 2010-11-10 | Discharge: 2010-11-10 | Disposition: A | Discharge status: Home / Self Care | Payer: Medicaid Other | Source: Ambulatory Visit | Attending: Family Medicine | Admitting: Family Medicine

## 2010-11-10 DIAGNOSIS — E042 Nontoxic multinodular goiter: Secondary | ICD-10-CM | POA: Insufficient documentation

## 2010-11-10 DIAGNOSIS — R1310 Dysphagia, unspecified: Secondary | ICD-10-CM | POA: Insufficient documentation

## 2010-11-10 MED ORDER — IOHEXOL 300 MG/ML  SOLN
80.0000 mL | Freq: Once | INTRAMUSCULAR | Status: AC | PRN
  Administered 2010-11-10: 80 mL via INTRAVENOUS

## 2010-11-18 ENCOUNTER — Emergency Department (HOSPITAL_COMMUNITY)
Admission: EM | Admit: 2010-11-18 | Discharge: 2010-11-18 | Disposition: A | Discharge status: Home / Self Care | Payer: Medicaid Other | Attending: Emergency Medicine | Admitting: Emergency Medicine

## 2010-11-18 ENCOUNTER — Emergency Department (HOSPITAL_COMMUNITY): Payer: Medicaid Other

## 2010-11-18 DIAGNOSIS — R1032 Left lower quadrant pain: Secondary | ICD-10-CM | POA: Insufficient documentation

## 2010-11-18 DIAGNOSIS — N739 Female pelvic inflammatory disease, unspecified: Secondary | ICD-10-CM | POA: Insufficient documentation

## 2010-11-18 DIAGNOSIS — R350 Frequency of micturition: Secondary | ICD-10-CM | POA: Insufficient documentation

## 2010-11-18 DIAGNOSIS — N760 Acute vaginitis: Secondary | ICD-10-CM | POA: Insufficient documentation

## 2010-11-18 DIAGNOSIS — N12 Tubulo-interstitial nephritis, not specified as acute or chronic: Secondary | ICD-10-CM | POA: Insufficient documentation

## 2010-11-18 DIAGNOSIS — B9689 Other specified bacterial agents as the cause of diseases classified elsewhere: Secondary | ICD-10-CM | POA: Insufficient documentation

## 2010-11-18 DIAGNOSIS — A499 Bacterial infection, unspecified: Secondary | ICD-10-CM | POA: Insufficient documentation

## 2010-11-18 DIAGNOSIS — R197 Diarrhea, unspecified: Secondary | ICD-10-CM | POA: Insufficient documentation

## 2010-11-18 DIAGNOSIS — R1110 Vomiting, unspecified: Secondary | ICD-10-CM | POA: Insufficient documentation

## 2010-11-18 DIAGNOSIS — F319 Bipolar disorder, unspecified: Secondary | ICD-10-CM | POA: Insufficient documentation

## 2010-11-18 DIAGNOSIS — M545 Low back pain, unspecified: Secondary | ICD-10-CM | POA: Insufficient documentation

## 2010-11-18 DIAGNOSIS — R569 Unspecified convulsions: Secondary | ICD-10-CM | POA: Insufficient documentation

## 2010-11-18 LAB — COMPREHENSIVE METABOLIC PANEL
ALT: 10 U/L (ref 0–35)
AST: 15 U/L (ref 0–37)
Albumin: 3.7 g/dL (ref 3.5–5.2)
Alkaline Phosphatase: 50 U/L (ref 39–117)
BUN: 15 mg/dL (ref 6–23)
CO2: 26 mEq/L (ref 19–32)
Calcium: 9.7 mg/dL (ref 8.4–10.5)
Chloride: 107 mEq/L (ref 96–112)
Creatinine, Ser: 0.79 mg/dL (ref 0.50–1.10)
GFR calc Af Amer: >60 mL/min (ref >60)
GFR calc non Af Amer: >60 mL/min (ref >60)
Glucose, Bld: 113 mg/dL — ABNORMAL HIGH (ref 70–99)
Potassium: 3.9 mEq/L (ref 3.5–5.1)
Sodium: 141 mEq/L (ref 135–145)
Total Bilirubin: 1 mg/dL (ref 0.3–1.2)
Total Protein: 7.7 g/dL (ref 6.0–8.3)

## 2010-11-18 LAB — URINALYSIS, ROUTINE W REFLEX MICROSCOPIC
Bilirubin Urine: NEGATIVE
Glucose, UA: NEGATIVE mg/dL
Ketones, ur: NEGATIVE mg/dL
Nitrite: NEGATIVE
Protein, ur: NEGATIVE mg/dL
Specific Gravity, Urine: 1.006 (ref 1.005–1.030)
Urobilinogen, UA: 1 mg/dL (ref 0.0–1.0)
pH: 8 (ref 5.0–8.0)

## 2010-11-18 LAB — DIFFERENTIAL
Basophils Absolute: 0 10*3/uL (ref 0.0–0.1)
Basophils Relative: 0 % (ref 0–1)
Eosinophils Absolute: 0.1 10*3/uL (ref 0.0–0.7)
Eosinophils Relative: 1 % (ref 0–5)
Lymphocytes Relative: 15 % (ref 12–46)
Lymphs Abs: 1.6 10*3/uL (ref 0.7–4.0)
Monocytes Absolute: 0.7 10*3/uL (ref 0.1–1.0)
Monocytes Relative: 7 % (ref 3–12)
Neutro Abs: 8 10*3/uL — ABNORMAL HIGH (ref 1.7–7.7)
Neutrophils Relative %: 76 % (ref 43–77)

## 2010-11-18 LAB — CBC
HCT: 36.7 % (ref 36.0–46.0)
Hemoglobin: 12.5 g/dL (ref 12.0–15.0)
MCH: 28 pg (ref 26.0–34.0)
MCHC: 34.1 g/dL (ref 30.0–36.0)
MCV: 82.1 fL (ref 78.0–100.0)
Platelets: 338 10*3/uL (ref 150–400)
RBC: 4.47 MIL/uL (ref 3.87–5.11)
RDW: 13.6 % (ref 11.5–15.5)
WBC: 10.7 10*3/uL — ABNORMAL HIGH (ref 4.0–10.5)

## 2010-11-18 LAB — WET PREP, GENITAL
Trich, Wet Prep: NONE SEEN
Yeast Wet Prep HPF POC: NONE SEEN

## 2010-11-18 LAB — POCT PREGNANCY, URINE: Preg Test, Ur: NEGATIVE

## 2010-11-18 LAB — URINE MICROSCOPIC-ADD ON

## 2010-11-18 LAB — LIPASE, BLOOD: Lipase: 45 U/L (ref 11–59)

## 2010-11-18 MED ORDER — IOHEXOL 300 MG/ML  SOLN
100.0000 mL | Freq: Once | INTRAMUSCULAR | Status: AC | PRN
  Administered 2010-11-18: 100 mL via INTRAVENOUS

## 2010-11-20 ENCOUNTER — Emergency Department (HOSPITAL_COMMUNITY)
Admission: EM | Admit: 2010-11-20 | Discharge: 2010-11-20 | Disposition: A | Discharge status: Home / Self Care | Payer: Medicaid Other | Attending: Emergency Medicine | Admitting: Emergency Medicine

## 2010-11-20 DIAGNOSIS — G40909 Epilepsy, unspecified, not intractable, without status epilepticus: Secondary | ICD-10-CM | POA: Insufficient documentation

## 2010-11-20 DIAGNOSIS — R300 Dysuria: Secondary | ICD-10-CM | POA: Insufficient documentation

## 2010-11-20 DIAGNOSIS — F319 Bipolar disorder, unspecified: Secondary | ICD-10-CM | POA: Insufficient documentation

## 2010-11-20 DIAGNOSIS — R61 Generalized hyperhidrosis: Secondary | ICD-10-CM | POA: Insufficient documentation

## 2010-11-20 DIAGNOSIS — R3915 Urgency of urination: Secondary | ICD-10-CM | POA: Insufficient documentation

## 2010-11-20 DIAGNOSIS — R350 Frequency of micturition: Secondary | ICD-10-CM | POA: Insufficient documentation

## 2010-11-20 DIAGNOSIS — Z79899 Other long term (current) drug therapy: Secondary | ICD-10-CM | POA: Insufficient documentation

## 2010-11-20 DIAGNOSIS — R109 Unspecified abdominal pain: Secondary | ICD-10-CM | POA: Insufficient documentation

## 2010-11-20 DIAGNOSIS — R1115 Cyclical vomiting syndrome unrelated to migraine: Secondary | ICD-10-CM | POA: Insufficient documentation

## 2010-11-20 DIAGNOSIS — R1915 Other abnormal bowel sounds: Secondary | ICD-10-CM | POA: Insufficient documentation

## 2010-11-20 DIAGNOSIS — R509 Fever, unspecified: Secondary | ICD-10-CM | POA: Insufficient documentation

## 2010-11-20 LAB — COMPREHENSIVE METABOLIC PANEL
ALT: 13 U/L (ref 0–35)
AST: 15 U/L (ref 0–37)
Albumin: 3.9 g/dL (ref 3.5–5.2)
Alkaline Phosphatase: 47 U/L (ref 39–117)
BUN: 11 mg/dL (ref 6–23)
CO2: 27 mEq/L (ref 19–32)
Calcium: 9.3 mg/dL (ref 8.4–10.5)
Chloride: 107 mEq/L (ref 96–112)
Creatinine, Ser: 0.69 mg/dL (ref 0.50–1.10)
GFR calc Af Amer: >60 mL/min (ref >60)
GFR calc non Af Amer: >60 mL/min (ref >60)
Glucose, Bld: 102 mg/dL — ABNORMAL HIGH (ref 70–99)
Potassium: 3.6 mEq/L (ref 3.5–5.1)
Sodium: 139 mEq/L (ref 135–145)
Total Bilirubin: 1.3 mg/dL — ABNORMAL HIGH (ref 0.3–1.2)
Total Protein: 7.5 g/dL (ref 6.0–8.3)

## 2010-11-20 LAB — DIFFERENTIAL
Basophils Absolute: 0.1 10*3/uL (ref 0.0–0.1)
Basophils Relative: 1 % (ref 0–1)
Eosinophils Absolute: 0.2 10*3/uL (ref 0.0–0.7)
Eosinophils Relative: 2 % (ref 0–5)
Lymphocytes Relative: 21 % (ref 12–46)
Lymphs Abs: 1.8 10*3/uL (ref 0.7–4.0)
Monocytes Absolute: 0.6 10*3/uL (ref 0.1–1.0)
Monocytes Relative: 7 % (ref 3–12)
Neutro Abs: 6.1 10*3/uL (ref 1.7–7.7)
Neutrophils Relative %: 70 % (ref 43–77)

## 2010-11-20 LAB — URINALYSIS, ROUTINE W REFLEX MICROSCOPIC
Bilirubin Urine: NEGATIVE
Glucose, UA: NEGATIVE mg/dL
Hgb urine dipstick: NEGATIVE
Ketones, ur: 15 mg/dL — AB
Leukocytes, UA: NEGATIVE
Nitrite: NEGATIVE
Protein, ur: NEGATIVE mg/dL
Specific Gravity, Urine: 1.016 (ref 1.005–1.030)
Urobilinogen, UA: 0.2 mg/dL (ref 0.0–1.0)
pH: 7.5 (ref 5.0–8.0)

## 2010-11-20 LAB — CBC
HCT: 35.3 % — ABNORMAL LOW (ref 36.0–46.0)
Hemoglobin: 11.7 g/dL — ABNORMAL LOW (ref 12.0–15.0)
MCH: 27.1 pg (ref 26.0–34.0)
MCHC: 33.1 g/dL (ref 30.0–36.0)
MCV: 81.7 fL (ref 78.0–100.0)
Platelets: 315 10*3/uL (ref 150–400)
RBC: 4.32 MIL/uL (ref 3.87–5.11)
RDW: 13.4 % (ref 11.5–15.5)
WBC: 8.7 10*3/uL (ref 4.0–10.5)

## 2010-11-20 LAB — LIPASE, BLOOD: Lipase: 27 U/L (ref 11–59)

## 2010-11-21 LAB — GC/CHLAMYDIA PROBE AMP, GENITAL
Chlamydia, DNA Probe: NEGATIVE
GC Probe Amp, Genital: NEGATIVE

## 2010-12-06 LAB — HEMOGLOBIN AND HEMATOCRIT, BLOOD
HCT: 29 — ABNORMAL LOW
Hemoglobin: 10 — ABNORMAL LOW

## 2010-12-07 ENCOUNTER — Inpatient Hospital Stay (INDEPENDENT_AMBULATORY_CARE_PROVIDER_SITE_OTHER)
Admission: RE | Admit: 2010-12-07 | Discharge: 2010-12-07 | Disposition: A | Discharge status: Home / Self Care | Payer: Medicaid Other | Source: Ambulatory Visit | Attending: Family Medicine | Admitting: Family Medicine

## 2010-12-07 DIAGNOSIS — M2550 Pain in unspecified joint: Secondary | ICD-10-CM

## 2010-12-07 DIAGNOSIS — R609 Edema, unspecified: Secondary | ICD-10-CM

## 2010-12-07 LAB — POCT URINALYSIS DIP (DEVICE)
Glucose, UA: NEGATIVE mg/dL
Hgb urine dipstick: NEGATIVE
Ketones, ur: NEGATIVE mg/dL
Nitrite: NEGATIVE
Protein, ur: NEGATIVE mg/dL
Specific Gravity, Urine: >=1.03 (ref 1.005–1.030)
Urobilinogen, UA: 0.2 mg/dL (ref 0.0–1.0)
pH: 5.5 (ref 5.0–8.0)

## 2010-12-07 LAB — POCT PREGNANCY, URINE: Preg Test, Ur: NEGATIVE

## 2010-12-13 ENCOUNTER — Emergency Department (HOSPITAL_COMMUNITY)
Admission: EM | Admit: 2010-12-13 | Discharge: 2010-12-13 | Disposition: A | Discharge status: Home / Self Care | Payer: Medicaid Other | Attending: Emergency Medicine | Admitting: Emergency Medicine

## 2010-12-13 DIAGNOSIS — M25549 Pain in joints of unspecified hand: Secondary | ICD-10-CM | POA: Insufficient documentation

## 2010-12-13 DIAGNOSIS — M7989 Other specified soft tissue disorders: Secondary | ICD-10-CM | POA: Insufficient documentation

## 2010-12-13 DIAGNOSIS — Z79899 Other long term (current) drug therapy: Secondary | ICD-10-CM | POA: Insufficient documentation

## 2010-12-13 DIAGNOSIS — Z9889 Other specified postprocedural states: Secondary | ICD-10-CM | POA: Insufficient documentation

## 2010-12-13 DIAGNOSIS — G40909 Epilepsy, unspecified, not intractable, without status epilepticus: Secondary | ICD-10-CM | POA: Insufficient documentation

## 2010-12-13 DIAGNOSIS — M25529 Pain in unspecified elbow: Secondary | ICD-10-CM | POA: Insufficient documentation

## 2010-12-13 DIAGNOSIS — F319 Bipolar disorder, unspecified: Secondary | ICD-10-CM | POA: Insufficient documentation

## 2010-12-13 LAB — COMPREHENSIVE METABOLIC PANEL
ALT: 12 U/L (ref 0–35)
AST: 10 U/L (ref 0–37)
Albumin: 3.5 g/dL (ref 3.5–5.2)
Alkaline Phosphatase: 56 U/L (ref 39–117)
BUN: 11 mg/dL (ref 6–23)
CO2: 26 mEq/L (ref 19–32)
Calcium: 9.1 mg/dL (ref 8.4–10.5)
Chloride: 101 mEq/L (ref 96–112)
Creatinine, Ser: 0.75 mg/dL (ref 0.50–1.10)
GFR calc Af Amer: >60 mL/min (ref >60)
GFR calc non Af Amer: >60 mL/min (ref >60)
Glucose, Bld: 91 mg/dL (ref 70–99)
Potassium: 3.5 mEq/L (ref 3.5–5.1)
Sodium: 138 mEq/L (ref 135–145)
Total Bilirubin: 0.6 mg/dL (ref 0.3–1.2)
Total Protein: 7.4 g/dL (ref 6.0–8.3)

## 2010-12-13 LAB — URINALYSIS, ROUTINE W REFLEX MICROSCOPIC
Bilirubin Urine: NEGATIVE
Glucose, UA: NEGATIVE mg/dL
Hgb urine dipstick: NEGATIVE
Ketones, ur: NEGATIVE mg/dL
Nitrite: NEGATIVE
Protein, ur: NEGATIVE mg/dL
Specific Gravity, Urine: 1.015 (ref 1.005–1.030)
Urobilinogen, UA: 0.2 mg/dL (ref 0.0–1.0)
pH: 5.5 (ref 5.0–8.0)

## 2010-12-13 LAB — CBC
HCT: 38 % (ref 36.0–46.0)
Hemoglobin: 12.6 g/dL (ref 12.0–15.0)
MCH: 27.7 pg (ref 26.0–34.0)
MCHC: 33.2 g/dL (ref 30.0–36.0)
MCV: 83.5 fL (ref 78.0–100.0)
Platelets: 312 10*3/uL (ref 150–400)
RBC: 4.55 MIL/uL (ref 3.87–5.11)
RDW: 13.4 % (ref 11.5–15.5)
WBC: 10.7 10*3/uL — ABNORMAL HIGH (ref 4.0–10.5)

## 2010-12-13 LAB — URINE MICROSCOPIC-ADD ON

## 2010-12-13 LAB — PREGNANCY, URINE: Preg Test, Ur: NEGATIVE

## 2010-12-21 ENCOUNTER — Emergency Department (HOSPITAL_COMMUNITY)
Admission: EM | Admit: 2010-12-21 | Discharge: 2010-12-21 | Disposition: A | Discharge status: Home / Self Care | Payer: Medicaid Other | Attending: Emergency Medicine | Admitting: Emergency Medicine

## 2010-12-21 DIAGNOSIS — R609 Edema, unspecified: Secondary | ICD-10-CM | POA: Insufficient documentation

## 2010-12-21 DIAGNOSIS — M2550 Pain in unspecified joint: Secondary | ICD-10-CM | POA: Insufficient documentation

## 2010-12-21 DIAGNOSIS — G40909 Epilepsy, unspecified, not intractable, without status epilepticus: Secondary | ICD-10-CM | POA: Insufficient documentation

## 2010-12-21 DIAGNOSIS — IMO0001 Reserved for inherently not codable concepts without codable children: Secondary | ICD-10-CM | POA: Insufficient documentation

## 2010-12-21 DIAGNOSIS — Z79899 Other long term (current) drug therapy: Secondary | ICD-10-CM | POA: Insufficient documentation

## 2010-12-22 LAB — CROSSMATCH
ABO/RH(D): A POS
Antibody Screen: NEGATIVE

## 2010-12-22 LAB — CBC
HCT: 37.8
HCT: 40.8
Hemoglobin: 12.9
Hemoglobin: 13.8
MCHC: 33.8
MCHC: 34
MCV: 81.2
MCV: 81.8
Platelets: 289
Platelets: 359
RBC: 4.65
RBC: 4.98
RDW: 12.9
RDW: 13.2
WBC: 16.9 — ABNORMAL HIGH
WBC: 7.3

## 2010-12-22 LAB — BASIC METABOLIC PANEL
BUN: 2 — ABNORMAL LOW
CO2: 29
Calcium: 7.8 — ABNORMAL LOW
Chloride: 103
Creatinine, Ser: 0.62
GFR calc Af Amer: >60
GFR calc non Af Amer: >60
Glucose, Bld: 118 — ABNORMAL HIGH
Potassium: 3.3 — ABNORMAL LOW
Sodium: 135

## 2010-12-22 LAB — COMPREHENSIVE METABOLIC PANEL
ALT: 15
ALT: 19
AST: 16
AST: 20
Albumin: 3.7
Albumin: 4.2
Alkaline Phosphatase: 58
Alkaline Phosphatase: 60
BUN: 5 — ABNORMAL LOW
BUN: 7
CO2: 25
CO2: 25
Calcium: 9.4
Calcium: 9.6
Chloride: 105
Chloride: 106
Creatinine, Ser: 0.76
Creatinine, Ser: 0.8
GFR calc Af Amer: >60
GFR calc Af Amer: >60
GFR calc non Af Amer: >60
GFR calc non Af Amer: >60
Glucose, Bld: 127 — ABNORMAL HIGH
Glucose, Bld: 83
Potassium: 3.6
Potassium: 4
Sodium: 137
Sodium: 138
Total Bilirubin: 1.3 — ABNORMAL HIGH
Total Bilirubin: 1.6 — ABNORMAL HIGH
Total Protein: 7.3
Total Protein: 8

## 2010-12-22 LAB — URINALYSIS, ROUTINE W REFLEX MICROSCOPIC
Bilirubin Urine: NEGATIVE
Glucose, UA: NEGATIVE
Ketones, ur: NEGATIVE
Nitrite: NEGATIVE
Protein, ur: NEGATIVE
Specific Gravity, Urine: 1.008
Urobilinogen, UA: 0.2
pH: 6

## 2010-12-22 LAB — DIFFERENTIAL
Basophils Absolute: 0.1
Basophils Absolute: 0.1
Basophils Relative: 0
Basophils Relative: 1
Eosinophils Absolute: 0
Eosinophils Absolute: 0.1 — ABNORMAL LOW
Eosinophils Relative: 0
Eosinophils Relative: 2
Lymphocytes Relative: 28
Lymphocytes Relative: 5 — ABNORMAL LOW
Lymphs Abs: 0.9
Lymphs Abs: 2.1
Monocytes Absolute: 0.5
Monocytes Absolute: 0.7
Monocytes Relative: 4
Monocytes Relative: 8
Neutro Abs: 15.2 — ABNORMAL HIGH
Neutro Abs: 4.5
Neutrophils Relative %: 62
Neutrophils Relative %: 90 — ABNORMAL HIGH

## 2010-12-22 LAB — URINE MICROSCOPIC-ADD ON

## 2010-12-22 LAB — PROTIME-INR
INR: 1
Prothrombin Time: 13.5

## 2010-12-22 LAB — TYPE AND SCREEN
ABO/RH(D): A POS
Antibody Screen: NEGATIVE

## 2010-12-22 LAB — LIPASE, BLOOD: Lipase: 23

## 2010-12-22 LAB — HEMOGLOBIN AND HEMATOCRIT, BLOOD
HCT: 26.6 — ABNORMAL LOW
Hemoglobin: 9 — ABNORMAL LOW

## 2010-12-22 LAB — APTT: aPTT: 30

## 2010-12-25 LAB — CBC
HCT: 39.4
Hemoglobin: 13.3
MCHC: 33.8
MCV: 81.5
Platelets: 304
RBC: 4.83
RDW: 13.3
WBC: 10.1

## 2010-12-25 LAB — DIFFERENTIAL
Basophils Absolute: 0.3 — ABNORMAL HIGH
Basophils Relative: 3 — ABNORMAL HIGH
Eosinophils Absolute: 0.1 — ABNORMAL LOW
Eosinophils Relative: 1
Lymphocytes Relative: 29
Lymphs Abs: 3
Monocytes Absolute: 0.6
Monocytes Relative: 6
Neutro Abs: 6.2
Neutrophils Relative %: 61

## 2010-12-25 LAB — COMPREHENSIVE METABOLIC PANEL
ALT: 15
AST: 16
Albumin: 4.1
Alkaline Phosphatase: 57
BUN: 4 — ABNORMAL LOW
CO2: 25
Calcium: 9.1
Chloride: 104
Creatinine, Ser: 0.76
GFR calc Af Amer: >60
GFR calc non Af Amer: >60
Glucose, Bld: 84
Potassium: 3.7
Sodium: 136
Total Bilirubin: 1.4 — ABNORMAL HIGH
Total Protein: 7.9

## 2010-12-25 LAB — URINALYSIS, ROUTINE W REFLEX MICROSCOPIC
Bilirubin Urine: NEGATIVE
Glucose, UA: NEGATIVE
Hgb urine dipstick: NEGATIVE
Ketones, ur: NEGATIVE
Nitrite: NEGATIVE
Protein, ur: NEGATIVE
Specific Gravity, Urine: 1.013
Urobilinogen, UA: 1
pH: 7.5

## 2010-12-25 LAB — ABO/RH: ABO/RH(D): A POS

## 2010-12-25 LAB — TYPE AND SCREEN
ABO/RH(D): A POS
Antibody Screen: NEGATIVE

## 2010-12-25 LAB — PROTIME-INR
INR: 1
Prothrombin Time: 13.6

## 2010-12-25 LAB — APTT: aPTT: 31

## 2010-12-25 LAB — URINE MICROSCOPIC-ADD ON

## 2011-01-02 LAB — CBC
HCT: 39
Hemoglobin: 13.2
MCHC: 33.8
MCV: 81.8
Platelets: 324
RBC: 4.76
RDW: 13.6
WBC: 9.9

## 2011-01-02 LAB — URINALYSIS, ROUTINE W REFLEX MICROSCOPIC
Bilirubin Urine: NEGATIVE
Glucose, UA: NEGATIVE
Ketones, ur: NEGATIVE
Nitrite: NEGATIVE
Protein, ur: NEGATIVE
Specific Gravity, Urine: 1.007
Urobilinogen, UA: 0.2
pH: 6

## 2011-01-02 LAB — DIFFERENTIAL
Basophils Absolute: 0
Basophils Relative: 1
Eosinophils Absolute: 0.1
Eosinophils Relative: 1
Lymphocytes Relative: 24
Lymphs Abs: 2.4
Monocytes Absolute: 0.7
Monocytes Relative: 7
Neutro Abs: 6.7
Neutrophils Relative %: 67

## 2011-01-02 LAB — COMPREHENSIVE METABOLIC PANEL
ALT: 16
AST: 15
Albumin: 3.8
Alkaline Phosphatase: 66
BUN: 4 — ABNORMAL LOW
CO2: 30
Calcium: 9.3
Chloride: 103
Creatinine, Ser: 0.57
GFR calc Af Amer: >60
GFR calc non Af Amer: >60
Glucose, Bld: 81
Potassium: 3.3 — ABNORMAL LOW
Sodium: 139
Total Bilirubin: 0.9
Total Protein: 7.6

## 2011-01-02 LAB — URINE MICROSCOPIC-ADD ON

## 2011-01-02 LAB — PROTIME-INR
INR: 1.1
Prothrombin Time: 14.1

## 2011-01-02 LAB — APTT: aPTT: 31

## 2011-01-03 ENCOUNTER — Other Ambulatory Visit: Payer: Self-pay | Admitting: Emergency Medicine

## 2011-01-03 ENCOUNTER — Emergency Department (HOSPITAL_COMMUNITY)
Admission: EM | Admit: 2011-01-03 | Discharge: 2011-01-03 | Disposition: A | Discharge status: Home / Self Care | Payer: Medicaid Other | Attending: Emergency Medicine | Admitting: Emergency Medicine

## 2011-01-03 DIAGNOSIS — M25569 Pain in unspecified knee: Secondary | ICD-10-CM | POA: Insufficient documentation

## 2011-01-03 DIAGNOSIS — G40909 Epilepsy, unspecified, not intractable, without status epilepticus: Secondary | ICD-10-CM | POA: Insufficient documentation

## 2011-01-03 LAB — COMPREHENSIVE METABOLIC PANEL
ALT: 12 U/L (ref 0–35)
AST: 18 U/L (ref 0–37)
Albumin: 3.6 g/dL (ref 3.5–5.2)
Alkaline Phosphatase: 55 U/L (ref 39–117)
BUN: 13 mg/dL (ref 6–23)
CO2: 22 mEq/L (ref 19–32)
Calcium: 9.8 mg/dL (ref 8.4–10.5)
Chloride: 101 mEq/L (ref 96–112)
Creatinine, Ser: 0.75 mg/dL (ref 0.50–1.10)
GFR calc Af Amer: >90 mL/min (ref >90)
GFR calc non Af Amer: >90 mL/min (ref >90)
Glucose, Bld: 105 mg/dL — ABNORMAL HIGH (ref 70–99)
Potassium: 4.2 mEq/L (ref 3.5–5.1)
Sodium: 134 mEq/L — ABNORMAL LOW (ref 135–145)
Total Bilirubin: 0.7 mg/dL (ref 0.3–1.2)
Total Protein: 7.9 g/dL (ref 6.0–8.3)

## 2011-01-03 LAB — DIFFERENTIAL
Basophils Absolute: 0 10*3/uL (ref 0.0–0.1)
Basophils Relative: 0 % (ref 0–1)
Eosinophils Absolute: 0.2 10*3/uL (ref 0.0–0.7)
Eosinophils Relative: 2 % (ref 0–5)
Lymphocytes Relative: 21 % (ref 12–46)
Lymphs Abs: 3 10*3/uL (ref 0.7–4.0)
Monocytes Absolute: 1.7 10*3/uL — ABNORMAL HIGH (ref 0.1–1.0)
Monocytes Relative: 12 % (ref 3–12)
Neutro Abs: 9.2 10*3/uL — ABNORMAL HIGH (ref 1.7–7.7)
Neutrophils Relative %: 65 % (ref 43–77)

## 2011-01-03 LAB — URINE MICROSCOPIC-ADD ON

## 2011-01-03 LAB — URINALYSIS, ROUTINE W REFLEX MICROSCOPIC
Bilirubin Urine: NEGATIVE
Glucose, UA: NEGATIVE mg/dL
Ketones, ur: NEGATIVE mg/dL
Nitrite: NEGATIVE
Protein, ur: NEGATIVE mg/dL
Specific Gravity, Urine: 1.016 (ref 1.005–1.030)
Urobilinogen, UA: 0.2 mg/dL (ref 0.0–1.0)
pH: 6.5 (ref 5.0–8.0)

## 2011-01-03 LAB — CBC
HCT: 39.8 % (ref 36.0–46.0)
Hemoglobin: 13.1 g/dL (ref 12.0–15.0)
MCH: 26.7 pg (ref 26.0–34.0)
MCHC: 32.9 g/dL (ref 30.0–36.0)
MCV: 81.2 fL (ref 78.0–100.0)
Platelets: 349 10*3/uL (ref 150–400)
RBC: 4.9 MIL/uL (ref 3.87–5.11)
RDW: 13.6 % (ref 11.5–15.5)
WBC: 14.2 10*3/uL — ABNORMAL HIGH (ref 4.0–10.5)

## 2011-01-03 LAB — CK: Total CK: 33 U/L (ref 7–177)

## 2011-01-04 LAB — POCT PREGNANCY, URINE: Preg Test, Ur: NEGATIVE

## 2011-03-20 DIAGNOSIS — M797 Fibromyalgia: Secondary | ICD-10-CM

## 2011-03-20 HISTORY — DX: Fibromyalgia: M79.7

## 2012-09-01 IMAGING — CR DG CERVICAL SPINE COMPLETE 4+V
5 series · 5 of 5 positions shown · non-contrast
Comparison: None.

CLINICAL DATA: Neck and shoulder pain with headaches.  No known
injury.

CERVICAL SPINE - COMPLETE 4+ VIEW

[w c-spine lat]
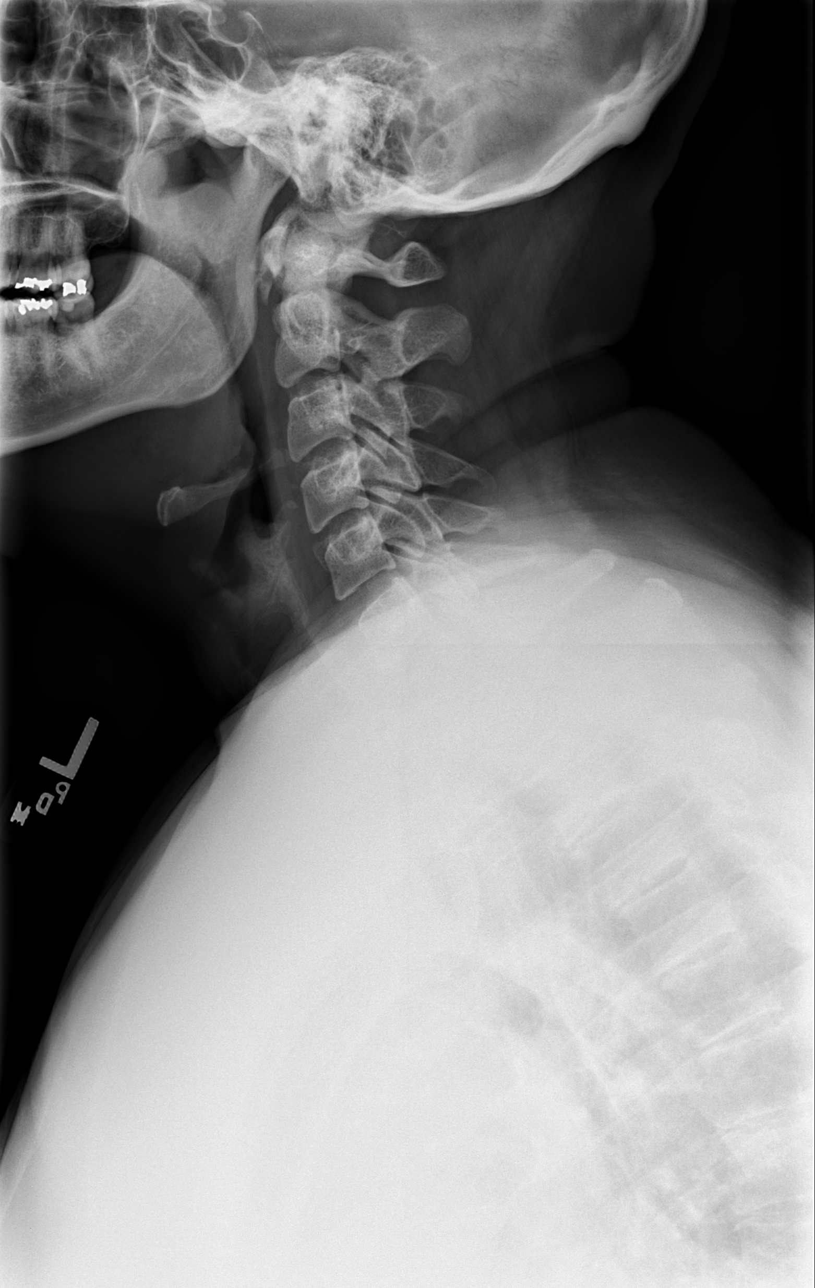

[w c-spine oblique (1 of 2)]
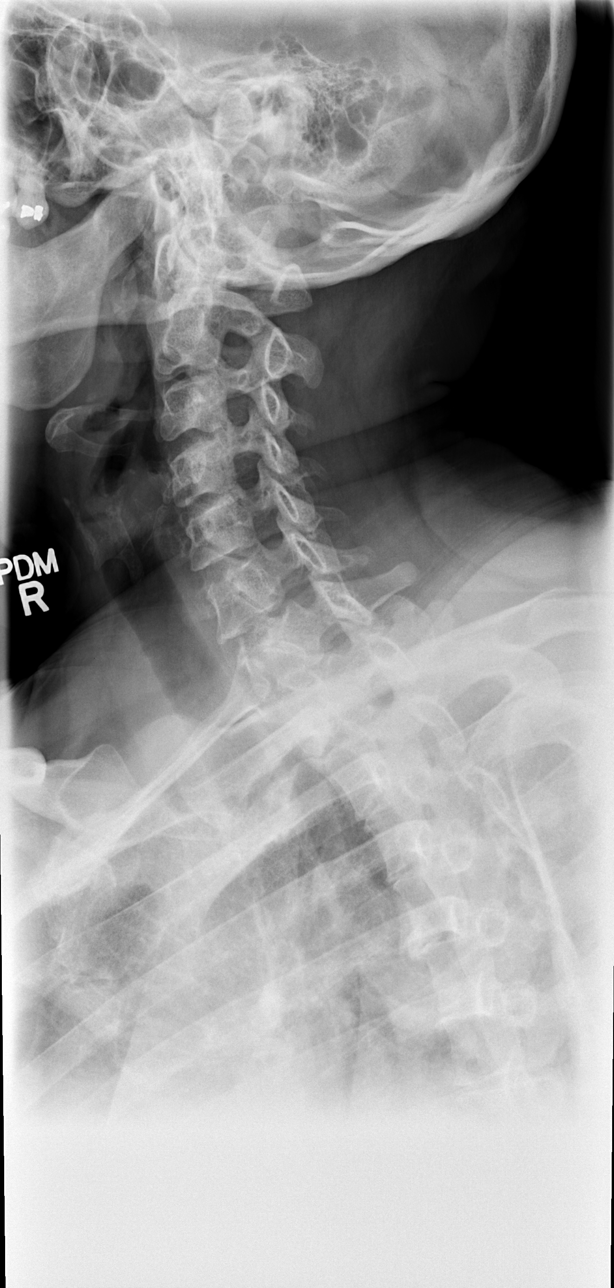

[w c-spine oblique (2 of 2)]
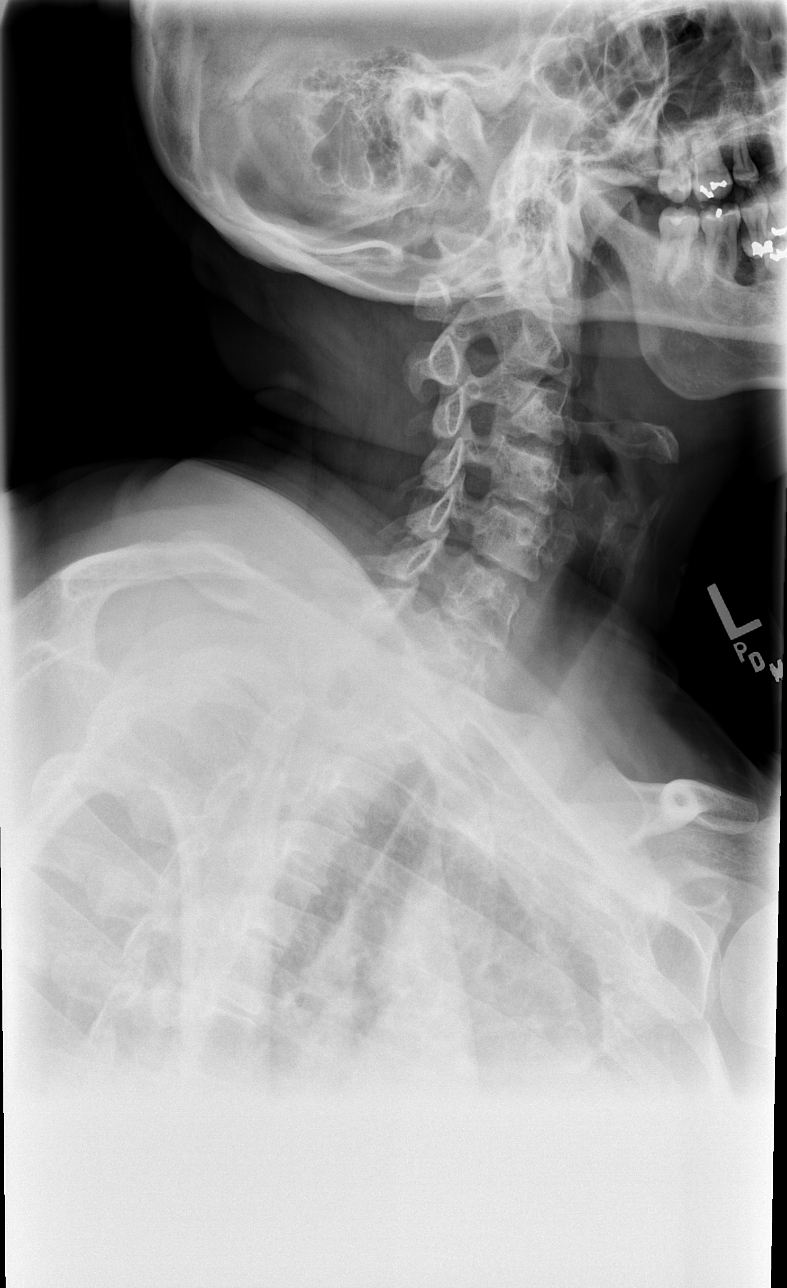

[w c-spine a.p.]
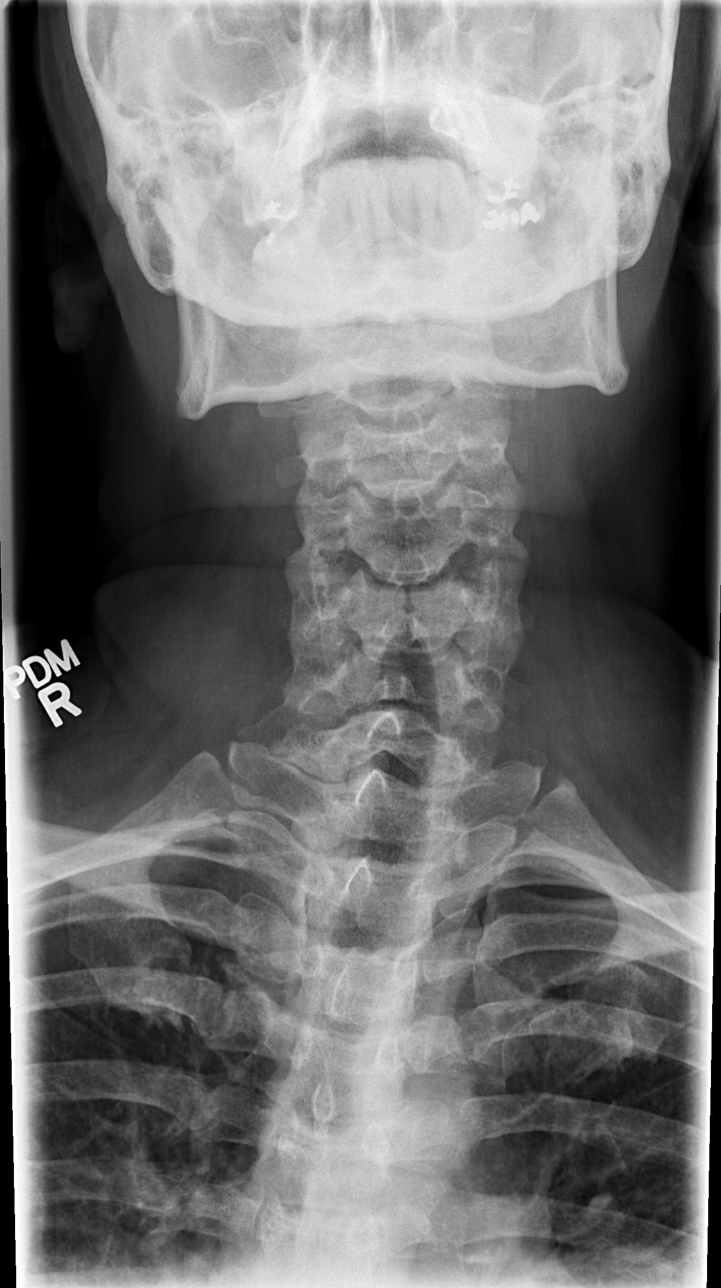

[w c-spine odontoid]
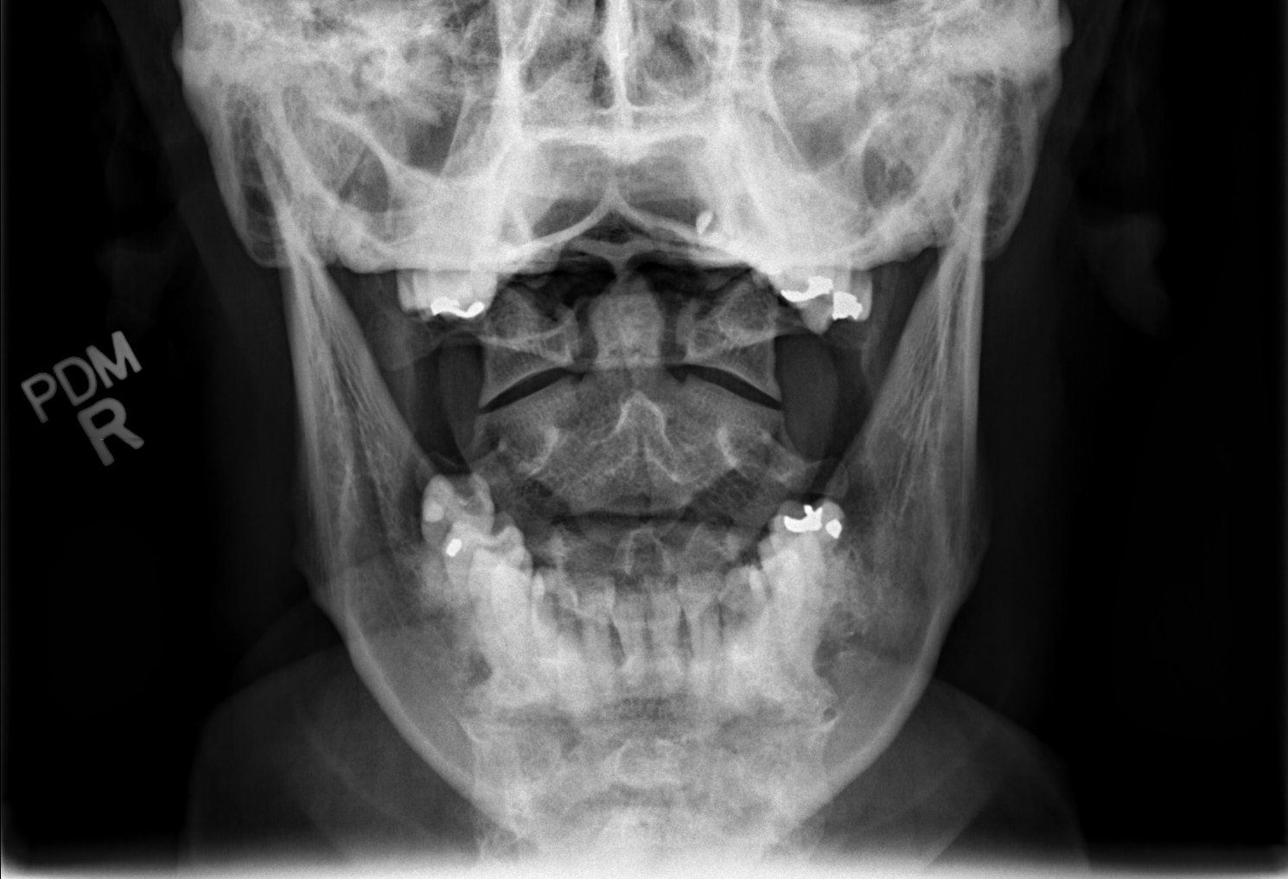

[5 of 5 positions shown; findings below may reference images not displayed]

FINDINGS: The prevertebral soft tissues are normal.  There is a
mild convex left scoliosis.  The lateral alignment is normal.
There is suboptimal visualization of the cervical spine in the
lateral projection inferior to C6.  The disc spaces are preserved.
Oblique views demonstrate no osseous foraminal stenosis.  No acute
osseous findings are identified.
IMPRESSION: Mild scoliosis.  No acute osseous findings or osseous foraminal
stenosis.

## 2012-12-17 ENCOUNTER — Emergency Department (HOSPITAL_COMMUNITY)
Admission: EM | Admit: 2012-12-17 | Discharge: 2012-12-17 | Disposition: A | Discharge status: Home / Self Care | Payer: Medicaid Other | Attending: Emergency Medicine | Admitting: Emergency Medicine

## 2012-12-17 ENCOUNTER — Emergency Department (HOSPITAL_COMMUNITY): Payer: Medicaid Other

## 2012-12-17 ENCOUNTER — Encounter (HOSPITAL_COMMUNITY): Payer: Self-pay | Admitting: Emergency Medicine

## 2012-12-17 DIAGNOSIS — Z79899 Other long term (current) drug therapy: Secondary | ICD-10-CM | POA: Insufficient documentation

## 2012-12-17 DIAGNOSIS — N390 Urinary tract infection, site not specified: Secondary | ICD-10-CM

## 2012-12-17 DIAGNOSIS — Z3202 Encounter for pregnancy test, result negative: Secondary | ICD-10-CM | POA: Insufficient documentation

## 2012-12-17 DIAGNOSIS — B9689 Other specified bacterial agents as the cause of diseases classified elsewhere: Secondary | ICD-10-CM

## 2012-12-17 DIAGNOSIS — R935 Abnormal findings on diagnostic imaging of other abdominal regions, including retroperitoneum: Secondary | ICD-10-CM

## 2012-12-17 DIAGNOSIS — N760 Acute vaginitis: Secondary | ICD-10-CM | POA: Insufficient documentation

## 2012-12-17 DIAGNOSIS — I10 Essential (primary) hypertension: Secondary | ICD-10-CM | POA: Insufficient documentation

## 2012-12-17 LAB — URINALYSIS, ROUTINE W REFLEX MICROSCOPIC
Bilirubin Urine: NEGATIVE
Glucose, UA: NEGATIVE mg/dL
Hgb urine dipstick: NEGATIVE
Ketones, ur: NEGATIVE mg/dL
Nitrite: NEGATIVE
Protein, ur: NEGATIVE mg/dL
Specific Gravity, Urine: 1.027 (ref 1.005–1.030)
Urobilinogen, UA: 1 mg/dL (ref 0.0–1.0)
pH: 5.5 (ref 5.0–8.0)

## 2012-12-17 LAB — CBC WITH DIFFERENTIAL/PLATELET
Basophils Absolute: 0.1 10*3/uL (ref 0.0–0.1)
Basophils Relative: 1 % (ref 0–1)
Eosinophils Absolute: 0.2 10*3/uL (ref 0.0–0.7)
Eosinophils Relative: 2 % (ref 0–5)
HCT: 38.2 % (ref 36.0–46.0)
Hemoglobin: 12.6 g/dL (ref 12.0–15.0)
Lymphocytes Relative: 28 % (ref 12–46)
Lymphs Abs: 3 10*3/uL (ref 0.7–4.0)
MCH: 26.8 pg (ref 26.0–34.0)
MCHC: 33 g/dL (ref 30.0–36.0)
MCV: 81.1 fL (ref 78.0–100.0)
Monocytes Absolute: 0.9 10*3/uL (ref 0.1–1.0)
Monocytes Relative: 9 % (ref 3–12)
Neutro Abs: 6.7 10*3/uL (ref 1.7–7.7)
Neutrophils Relative %: 61 % (ref 43–77)
Platelets: 305 10*3/uL (ref 150–400)
RBC: 4.71 MIL/uL (ref 3.87–5.11)
RDW: 13.4 % (ref 11.5–15.5)
WBC: 10.9 10*3/uL — ABNORMAL HIGH (ref 4.0–10.5)

## 2012-12-17 LAB — WET PREP, GENITAL: Trich, Wet Prep: NONE SEEN

## 2012-12-17 LAB — BASIC METABOLIC PANEL
BUN: 11 mg/dL (ref 6–23)
CO2: 27 mEq/L (ref 19–32)
Calcium: 9.4 mg/dL (ref 8.4–10.5)
Chloride: 98 mEq/L (ref 96–112)
Creatinine, Ser: 0.83 mg/dL (ref 0.50–1.10)
GFR calc Af Amer: >90 mL/min (ref >90)
GFR calc non Af Amer: 86 mL/min — ABNORMAL LOW (ref >90)
Glucose, Bld: 100 mg/dL — ABNORMAL HIGH (ref 70–99)
Potassium: 3.9 mEq/L (ref 3.5–5.1)
Sodium: 135 mEq/L (ref 135–145)

## 2012-12-17 LAB — HEPATIC FUNCTION PANEL
ALT: 18 U/L (ref 0–35)
AST: 23 U/L (ref 0–37)
Albumin: 3.5 g/dL (ref 3.5–5.2)
Alkaline Phosphatase: 69 U/L (ref 39–117)
Bilirubin, Direct: 0.1 mg/dL (ref 0.0–0.3)
Indirect Bilirubin: 0.6 mg/dL (ref 0.3–0.9)
Total Bilirubin: 0.7 mg/dL (ref 0.3–1.2)
Total Protein: 7.8 g/dL (ref 6.0–8.3)

## 2012-12-17 LAB — URINE MICROSCOPIC-ADD ON

## 2012-12-17 LAB — PREGNANCY, URINE: Preg Test, Ur: NEGATIVE

## 2012-12-17 LAB — OCCULT BLOOD, POC DEVICE: Fecal Occult Bld: NEGATIVE

## 2012-12-17 LAB — LIPASE, BLOOD: Lipase: 33 U/L (ref 11–59)

## 2012-12-17 MED ORDER — METRONIDAZOLE 500 MG PO TABS: 500.0000 mg | ORAL_TABLET | Freq: Two times a day (BID) | ORAL | Status: DC

## 2012-12-17 MED ORDER — IOHEXOL 300 MG/ML  SOLN
50.0000 mL | Freq: Once | INTRAMUSCULAR | Status: AC | PRN
  Administered 2012-12-17: 50 mL via ORAL

## 2012-12-17 MED ORDER — SODIUM CHLORIDE 0.9 % IV BOLUS (SEPSIS)
1000.0000 mL | Freq: Once | INTRAVENOUS | Status: AC
  Administered 2012-12-17: 1000 mL via INTRAVENOUS

## 2012-12-17 MED ORDER — SULFAMETHOXAZOLE-TRIMETHOPRIM 800-160 MG PO TABS: 1.0000 | ORAL_TABLET | Freq: Two times a day (BID) | ORAL | Status: DC

## 2012-12-17 MED ORDER — MORPHINE SULFATE 4 MG/ML IJ SOLN
4.0000 mg | Freq: Once | INTRAMUSCULAR | Status: AC
  Administered 2012-12-17: 4 mg via INTRAVENOUS
  Filled 2012-12-17: qty 1

## 2012-12-17 MED ORDER — ONDANSETRON HCL 4 MG/2ML IJ SOLN
4.0000 mg | Freq: Once | INTRAMUSCULAR | Status: AC
  Administered 2012-12-17: 4 mg via INTRAVENOUS
  Filled 2012-12-17: qty 2

## 2012-12-17 MED ORDER — ONDANSETRON HCL 4 MG PO TABS: 4.0000 mg | ORAL_TABLET | Freq: Four times a day (QID) | ORAL | Status: DC

## 2012-12-17 MED ORDER — IOHEXOL 300 MG/ML  SOLN
100.0000 mL | Freq: Once | INTRAMUSCULAR | Status: AC | PRN
  Administered 2012-12-17: 100 mL via INTRAVENOUS

## 2012-12-17 NOTE — ED Notes (Signed)
Patient transported to CT 

## 2012-12-17 NOTE — ED Notes (Signed)
Pt c/o RLQ abd pain x 3 days w/ nausea and diarrhea.  Sent from [facility].

## 2012-12-17 NOTE — ED Provider Notes (Signed)
  Medical screening examination/treatment/procedure(s) were performed by non-physician practitioner and as supervising physician I was immediately available for consultation/collaboration.    Gerhard Munch, MD 12/17/12 [phone]

## 2012-12-17 NOTE — ED Provider Notes (Signed)
CSN: [id number]     Arrival date & time 12/17/12  1811 History   First MD Initiated Contact with Patient 12/17/12 2019     Chief Complaint  Patient presents with  . Abdominal Pain   (Consider location/radiation/quality/duration/timing/severity/associated sxs/prior Treatment) HPI  42 year old female presents complaining of abdominal pain. Patient reports for the past 3 days she has had gradual onset of pain to her right lower quadrant. Describe pain as a sharp and aching sensation, worsening with movement especially with walking. Endorse having nausea and also diarrhea. Has 3 bouts of diarrhea per day, diarrhea is nonbloody, non-mucousy. She has decreased appetite. She endorses feeling hot but denies having fever or chills. Does reports nausea as of breath and the pain is significant. No specific treatment tried. No headache or neck pain, chest pain, productive cough, hemoptysis and, and dysuria, vaginal discharge. Her last period was several days ago. No prior history of abdominal surgery. She has an intact gallbladder and intact appendix.  Past Medical History  Diagnosis Date  . Hypertension    Past Surgical History  Procedure Laterality Date  . Tonsillectomy    . Back surgery    . Shoulder surgery     Family History  Problem Relation Age of Onset  . Heart disease Father     CAD at age 36  . Heart disease Daughter     MVP   History  Substance Use Topics  . Smoking status: Never Smoker   . Smokeless tobacco: Not on file  . Alcohol Use: No   OB History   Grav Para Term Preterm Abortions TAB SAB Ect Mult Living                 Review of Systems  All other systems reviewed and are negative.    Allergies  Codeine and Magnesium-containing compounds  Home Medications   Current Outpatient Rx  Name  Route  Sig  Dispense  Refill  . levETIRAcetam (KEPPRA) 500 MG tablet      2 tabs po qd          . lisinopril (PRINIVIL,ZESTRIL) 5 MG tablet   Oral   Take 5 mg by mouth  daily.         . traMADol (ULTRAM) 50 MG tablet   Oral   Take 50 mg by mouth every 6 (six) hours as needed for pain.          BP 116/78  Pulse 100  Temp(Src) 98.7 F (37.1 C) (Oral)  Resp 16  SpO2 100% Physical Exam  Nursing note and vitals reviewed. Constitutional: She appears well-developed and well-nourished. No distress.  Awake, alert, nontoxic appearance  HENT:  Head: Atraumatic.  Eyes: Conjunctivae are normal. Right eye exhibits no discharge. Left eye exhibits no discharge.  Neck: Neck supple.  Cardiovascular: Normal rate and regular rhythm.   Pulmonary/Chest: Effort normal. No respiratory distress. She exhibits no tenderness.  Abdominal: Soft. There is tenderness (right midabdomen and right lower quadrant abdominal tenderness on palpation without guarding or rebound tenderness. No hernia noted. No overlying skin changes). There is no rebound.  No murphy sign.  +McBurney's point  Genitourinary:  No CVA tenderness.  Musculoskeletal: She exhibits no tenderness.  Baseline ROM, no obvious new focal weakness  Neurological:  Mental status and motor strength appears baseline for patient and situation  Skin: No rash noted.  Psychiatric: She has a normal mood and affect.    ED Course  Procedures (including critical care time)  8:49  PM Patient with right lower quadrant abdominal pain. Symptoms requiring to rule out for appendicitis. Patient is nontoxic with a nonsurgical abdomen.  11:31 PM Patient has evidence of PTA, UTI. Will treat with Flagyl and Bactrim. Her abdominal and pelvis CT scan shows inflammation at the terminal ileum which suggestive of Crohn's disease. However this finding is nonspecific. Will give patient referral to GI specialist for further evaluation. Otherwise patient stable for discharge. The symptom has improved. Care discussed with attending who agrees with plan.  Labs Review Labs Reviewed  WET PREP, GENITAL - Abnormal; Notable for the following:     Yeast Wet Prep HPF POC FEW (*)    Clue Cells Wet Prep HPF POC FEW (*)    WBC, Wet Prep HPF POC TOO NUMEROUS TO COUNT (*)    All other components within normal limits  CBC WITH DIFFERENTIAL - Abnormal; Notable for the following:    WBC 10.9 (*)    All other components within normal limits  BASIC METABOLIC PANEL - Abnormal; Notable for the following:    Glucose, Bld 100 (*)    GFR calc non Af Amer 86 (*)    All other components within normal limits  URINALYSIS, ROUTINE W REFLEX MICROSCOPIC - Abnormal; Notable for the following:    APPearance CLOUDY (*)    Leukocytes, UA MODERATE (*)    All other components within normal limits  URINE MICROSCOPIC-ADD ON - Abnormal; Notable for the following:    Squamous Epithelial / LPF FEW (*)    Bacteria, UA FEW (*)    All other components within normal limits  GC/CHLAMYDIA PROBE AMP  URINE CULTURE  HEPATIC FUNCTION PANEL  LIPASE, BLOOD  PREGNANCY, URINE  OCCULT BLOOD, POC DEVICE   Imaging Review Ct Abdomen Pelvis W Contrast  12/17/2012   CLINICAL DATA:  Right lower quadrant abdominal pain with nausea and diarrhea.  EXAM: CT ABDOMEN AND PELVIS WITH CONTRAST  TECHNIQUE: Multidetector CT imaging of the abdomen and pelvis was performed using the standard protocol following bolus administration of intravenous contrast.  CONTRAST:  OMNIPAQUE IOHEXOL 300 MG/ML  SOLN  COMPARISON:  11/18/2010.  FINDINGS: The lung bases are clear except for bibasilar scarring and atelectasis.  The liver is unremarkable. No focal hepatic lesions or intrahepatic biliary dilatation. The gallbladder is normal. A phyrigian cap is noted. No common bile duct dilatation. The pancreas is normal. The spleen is mildly enlarged measuring 13.5 x 10.3 x 9.0 cm. The adrenal glands and kidneys are normal. The right kidney demonstrates a normal variant of under rotation. No hydronephrosis or renal calculi.  The stomach, over the duodenum and small bowel are unremarkable except for mild  wall thickening and mucosal enhancement involving the terminal ileum. Findings could suggest Crohn's disease.  The colon is unremarkable. The appendix is normal. No mesenteric or retroperitoneal mass or adenopathy. Small scattered lymph nodes are noted. The aorta is normal.  The uterus and ovaries are normal. No pelvic mass, adenopathy or free pelvic fluid collections. The bladder is normal. No inguinal mass or adenopathy.  Examination of the bony structures demonstrates L4-5 fusion changes. No complicating features.  IMPRESSION: Findings suspicious for Crohn's disease involving the terminal ileum.  No other significant findings except for mild splenomegaly.   Electronically Signed   By: Loralie Champagne M.D.   On: 12/17/2012 22:08    MDM   1. BV (bacterial vaginosis)   2. UTI (lower urinary tract infection)   3. Abnormal abdominal CT scan  BP 116/78  Pulse 100  Temp(Src) 98.7 F (37.1 C) (Oral)  Resp 16  SpO2 100%  I have reviewed nursing notes and vital signs. I personally reviewed the imaging tests through PACS system  I reviewed available ER/hospitalization records thought the EMR     Fayrene Helper, [place] 12/17/12 2332

## 2012-12-18 LAB — GC/CHLAMYDIA PROBE AMP
CT Probe RNA: NEGATIVE
GC Probe RNA: NEGATIVE

## 2012-12-19 LAB — URINE CULTURE: Colony Count: 30000

## 2013-08-16 ENCOUNTER — Emergency Department (HOSPITAL_COMMUNITY)
Admission: EM | Admit: 2013-08-16 | Discharge: 2013-08-16 | Disposition: A | Discharge status: Home / Self Care | Payer: Medicaid Other | Attending: Emergency Medicine | Admitting: Emergency Medicine

## 2013-08-16 ENCOUNTER — Encounter (HOSPITAL_COMMUNITY): Payer: Self-pay | Admitting: Emergency Medicine

## 2013-08-16 DIAGNOSIS — R51 Headache: Secondary | ICD-10-CM | POA: Insufficient documentation

## 2013-08-16 DIAGNOSIS — R519 Headache, unspecified: Secondary | ICD-10-CM

## 2013-08-16 DIAGNOSIS — Z79899 Other long term (current) drug therapy: Secondary | ICD-10-CM | POA: Insufficient documentation

## 2013-08-16 DIAGNOSIS — Z87440 Personal history of urinary (tract) infections: Secondary | ICD-10-CM | POA: Insufficient documentation

## 2013-08-16 DIAGNOSIS — I10 Essential (primary) hypertension: Secondary | ICD-10-CM | POA: Insufficient documentation

## 2013-08-16 DIAGNOSIS — Z7982 Long term (current) use of aspirin: Secondary | ICD-10-CM | POA: Insufficient documentation

## 2013-08-16 DIAGNOSIS — R112 Nausea with vomiting, unspecified: Secondary | ICD-10-CM | POA: Insufficient documentation

## 2013-08-16 HISTORY — DX: Benign neoplasm, unspecified site: D36.9

## 2013-08-16 HISTORY — DX: Urinary tract infection, site not specified: N39.0

## 2013-08-16 MED ORDER — DIPHENHYDRAMINE HCL 50 MG/ML IJ SOLN
25.0000 mg | Freq: Once | INTRAMUSCULAR | Status: AC
  Administered 2013-08-16: 25 mg via INTRAVENOUS
  Filled 2013-08-16: qty 1

## 2013-08-16 MED ORDER — METOCLOPRAMIDE HCL 5 MG/ML IJ SOLN
10.0000 mg | Freq: Once | INTRAMUSCULAR | Status: AC
  Administered 2013-08-16: 10 mg via INTRAVENOUS
  Filled 2013-08-16: qty 2

## 2013-08-16 MED ORDER — KETOROLAC TROMETHAMINE 30 MG/ML IJ SOLN
30.0000 mg | Freq: Once | INTRAMUSCULAR | Status: AC
  Administered 2013-08-16: 30 mg via INTRAVENOUS
  Filled 2013-08-16: qty 1

## 2013-08-16 MED ORDER — LORAZEPAM 2 MG/ML IJ SOLN
1.0000 mg | Freq: Once | INTRAMUSCULAR | Status: AC
  Administered 2013-08-16: 1 mg via INTRAVENOUS
  Filled 2013-08-16: qty 1

## 2013-08-16 NOTE — ED Notes (Addendum)
Per pt report: Pt reports a headache in the back of head and neck.  Pt describes the pain as sharp.  Pt reports she was sleeping and woke up with the headache.  Pt reports being sick right before going to bed and vomited several times.  Pt reports having an episode of a headache like this several years ago but this time is much worse.  Pt reports feeling like she wants to "pass out."  Pt a/o x 4. 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

## 2013-08-16 NOTE — Discharge Instructions (Signed)

## 2013-08-16 NOTE — ED Provider Notes (Signed)
CSN: [id number]     Arrival date & time 08/16/13  0246 History   First MD Initiated Contact with Patient 08/16/13 [phone]     No chief complaint on file.    (Consider location/radiation/quality/duration/timing/severity/associated sxs/prior Treatment) HPI  43-year-old female presents to emergency department from home with complaint of headache.  Headache woke her from sleep along with nausea and vomiting.  Headache is in the back of her head.  She has not been able to stop vomiting.  She denies any fever or chills.  She has had similar headaches in the past.  Patient reports she has history of benign tumor in her left medial temporal lobe.  Medical records accessed, patient has had recent CT scans and MRIs without this finding.  Patient denies any abdominal pain, no recent URIs, no change in vision no weakness no numbness no other focal neurologic signs.  Past Medical History  Diagnosis Date  . Hypertension   . Urinary tract infection   . Tumor cells, benign    Past Surgical History  Procedure Laterality Date  . Tonsillectomy    . Back surgery    . Shoulder surgery     Family History  Problem Relation Age of Onset  . Heart disease Father     CAD at age 26  . Heart disease Daughter     MVP   History  Substance Use Topics  . Smoking status: Never Smoker   . Smokeless tobacco: Not on file  . Alcohol Use: No   OB History   Grav Para Term Preterm Abortions TAB SAB Ect Mult Living                 Review of Systems   See History of Present Illness; otherwise all other systems are reviewed and negative  Allergies  Codeine and Magnesium-containing compounds  Home Medications   Prior to Admission medications   Medication Sig Start Date End Date Taking? Authorizing Provider  ALPRAZolam Duanne Moron) 0.5 MG tablet Take 0.5 mg by mouth 3 (three) times daily as needed for anxiety.   Yes Historical Provider, MD  aspirin EC 81 MG tablet Take 81 mg by mouth daily.   Yes Historical Provider,  MD  lisinopril (PRINIVIL,ZESTRIL) 5 MG tablet Take 5 mg by mouth daily.   Yes Historical Provider, MD  traMADol (ULTRAM) 50 MG tablet Take 50 mg by mouth every 6 (six) hours as needed.   Yes Historical Provider, MD  levETIRAcetam (KEPPRA) 500 MG tablet 2 tabs po qd     Historical Provider, MD   BP 133/82  Pulse 104  Temp(Src) 98.3 F (36.8 C) (Oral)  Resp 18  SpO2 99%  LMP 07/31/2013 Physical Exam  Nursing note and vitals reviewed. Constitutional: She is oriented to person, place, and time. She appears well-developed and well-nourished. She appears distressed (uncomfortable appearing).  HENT:  Head: Normocephalic and atraumatic.  Right Ear: External ear normal.  Left Ear: External ear normal.  Nose: Nose normal.  Mouth/Throat: Oropharynx is clear and moist.  Patient has point tenderness to center of lower occiput.  There is no crepitus step-off or deformity to this area.  She has some diffuse tenderness to the upper cervical spine area again without step-off or crepitus.  Eyes: Conjunctivae and EOM are normal. Pupils are equal, round, and reactive to light.  Neck: Normal range of motion. Neck supple. No JVD present. No tracheal deviation present. No thyromegaly present.  Cardiovascular: Normal rate, regular rhythm, normal heart sounds and intact  distal pulses.  Exam reveals no gallop and no friction rub.   No murmur heard. Pulmonary/Chest: Effort normal and breath sounds normal. No stridor. No respiratory distress. She has no wheezes. She has no rales. She exhibits no tenderness.  Abdominal: Soft. Bowel sounds are normal. She exhibits no distension and no mass. There is no tenderness. There is no rebound and no guarding.  Musculoskeletal: Normal range of motion. She exhibits no edema and no tenderness.  Lymphadenopathy:    She has no cervical adenopathy.  Neurological: She is alert and oriented to person, place, and time. She has normal reflexes. No cranial nerve deficit. She exhibits  normal muscle tone. Coordination normal.  Skin: Skin is warm and dry. No rash noted. No erythema. No pallor.  Psychiatric: She has a normal mood and affect. Her behavior is normal. Judgment and thought content normal.    ED Course  Procedures (including critical care time) Labs Review Labs Reviewed - No data to display  Imaging Review No results found.   EKG Interpretation None      MDM   Final diagnoses:  Headache    43 year old female with acute headache with nausea and vomiting.  Symptoms seem consistent with her prior headaches.  Patient noted be tachycardic upon arrival, but per nurses were throwing up at that time.  Plan for headache cocktail and reassessment.  6:16 AM   Pt feeling better.  Will d/c home.     Kalman Drape, MD 08/16/13 [phone]

## 2013-08-22 ENCOUNTER — Encounter (HOSPITAL_COMMUNITY): Payer: Self-pay | Admitting: Emergency Medicine

## 2013-08-22 ENCOUNTER — Emergency Department (HOSPITAL_COMMUNITY)
Admission: EM | Admit: 2013-08-22 | Discharge: 2013-08-22 | Disposition: A | Discharge status: Home / Self Care | Payer: Medicaid Other | Attending: Emergency Medicine | Admitting: Emergency Medicine

## 2013-08-22 ENCOUNTER — Emergency Department (HOSPITAL_COMMUNITY): Payer: Medicaid Other

## 2013-08-22 DIAGNOSIS — Z87440 Personal history of urinary (tract) infections: Secondary | ICD-10-CM | POA: Insufficient documentation

## 2013-08-22 DIAGNOSIS — R509 Fever, unspecified: Secondary | ICD-10-CM | POA: Insufficient documentation

## 2013-08-22 DIAGNOSIS — I10 Essential (primary) hypertension: Secondary | ICD-10-CM | POA: Insufficient documentation

## 2013-08-22 DIAGNOSIS — Z7982 Long term (current) use of aspirin: Secondary | ICD-10-CM | POA: Insufficient documentation

## 2013-08-22 DIAGNOSIS — R519 Headache, unspecified: Secondary | ICD-10-CM

## 2013-08-22 DIAGNOSIS — M542 Cervicalgia: Secondary | ICD-10-CM

## 2013-08-22 DIAGNOSIS — R569 Unspecified convulsions: Secondary | ICD-10-CM | POA: Insufficient documentation

## 2013-08-22 DIAGNOSIS — T148XXA Other injury of unspecified body region, initial encounter: Secondary | ICD-10-CM

## 2013-08-22 DIAGNOSIS — IMO0001 Reserved for inherently not codable concepts without codable children: Secondary | ICD-10-CM | POA: Insufficient documentation

## 2013-08-22 DIAGNOSIS — G43909 Migraine, unspecified, not intractable, without status migrainosus: Secondary | ICD-10-CM

## 2013-08-22 DIAGNOSIS — Z79899 Other long term (current) drug therapy: Secondary | ICD-10-CM | POA: Insufficient documentation

## 2013-08-22 DIAGNOSIS — Z888 Allergy status to other drugs, medicaments and biological substances status: Secondary | ICD-10-CM | POA: Insufficient documentation

## 2013-08-22 DIAGNOSIS — R51 Headache: Secondary | ICD-10-CM

## 2013-08-22 DIAGNOSIS — G43109 Migraine with aura, not intractable, without status migrainosus: Secondary | ICD-10-CM | POA: Insufficient documentation

## 2013-08-22 DIAGNOSIS — D369 Benign neoplasm, unspecified site: Secondary | ICD-10-CM | POA: Insufficient documentation

## 2013-08-22 HISTORY — DX: Unspecified convulsions: R56.9

## 2013-08-22 HISTORY — DX: Fibromyalgia: M79.7

## 2013-08-22 LAB — POC URINE PREG, ED: Preg Test, Ur: NEGATIVE

## 2013-08-22 MED ORDER — SODIUM CHLORIDE 0.9 % IV BOLUS (SEPSIS)
1000.0000 mL | Freq: Once | INTRAVENOUS | Status: AC
  Administered 2013-08-22: 1000 mL via INTRAVENOUS

## 2013-08-22 MED ORDER — METOCLOPRAMIDE HCL 5 MG/ML IJ SOLN
10.0000 mg | Freq: Once | INTRAMUSCULAR | Status: AC
  Administered 2013-08-22: 10 mg via INTRAVENOUS
  Filled 2013-08-22: qty 2

## 2013-08-22 MED ORDER — DIPHENHYDRAMINE HCL 50 MG/ML IJ SOLN: 25.0000 mg | Freq: Once | INTRAMUSCULAR | Status: DC

## 2013-08-22 MED ORDER — FENTANYL CITRATE 0.05 MG/ML IJ SOLN
50.0000 ug | Freq: Once | INTRAMUSCULAR | Status: AC
  Administered 2013-08-22: 50 ug via INTRAVENOUS
  Filled 2013-08-22: qty 2

## 2013-08-22 MED ORDER — KETOROLAC TROMETHAMINE 30 MG/ML IJ SOLN
30.0000 mg | Freq: Once | INTRAMUSCULAR | Status: AC
  Administered 2013-08-22: 30 mg via INTRAVENOUS
  Filled 2013-08-22: qty 1

## 2013-08-22 MED ORDER — DIPHENHYDRAMINE HCL 50 MG/ML IJ SOLN
12.5000 mg | Freq: Once | INTRAMUSCULAR | Status: AC
  Administered 2013-08-22: 12.5 mg via INTRAVENOUS
  Filled 2013-08-22: qty 1

## 2013-08-22 MED ORDER — CYCLOBENZAPRINE HCL 10 MG PO TABS: 10.0000 mg | ORAL_TABLET | Freq: Two times a day (BID) | ORAL | Status: DC | PRN

## 2013-08-22 MED ORDER — LEVETIRACETAM 500 MG PO TABS: 500.0000 mg | ORAL_TABLET | Freq: Two times a day (BID) | ORAL | Status: DC

## 2013-08-22 MED ORDER — SODIUM CHLORIDE 0.9 % IV SOLN
1000.0000 mg | INTRAVENOUS | Status: AC
  Administered 2013-08-22: 1000 mg via INTRAVENOUS
  Filled 2013-08-22: qty 10

## 2013-08-22 MED ORDER — ONDANSETRON HCL 4 MG/2ML IJ SOLN
4.0000 mg | Freq: Once | INTRAMUSCULAR | Status: AC
Start: 2013-08-22 — End: 2013-08-22
  Administered 2013-08-22: 4 mg via INTRAVENOUS
  Filled 2013-08-22: qty 2

## 2013-08-22 NOTE — ED Provider Notes (Signed)
CSN: [id number]     Arrival date & time 08/22/13  0218 History   First MD Initiated Contact with Patient 08/22/13 [phone]     Chief Complaint  Patient presents with  . Migraine     (Consider location/radiation/quality/duration/timing/severity/associated sxs/prior Treatment) The history is provided by the patient. No language interpreter was used.  Suzanne Velazquez is a 43 y/o F with PMHx of HTN, UTI, benign tumor, fibromyalgia, seizures presenting to the ED with headache that has been ongoing since Saturday. Patient reported that the discomfort is localized to the left side of the head and neck described as a pressure sensation. Reported that she has been having variation in intensities of the pressure sensation. Stated that she has discomfort behind her left eye - stated that she has mild blurry vision. Reported that she has been feeling nauseous and dizzy. Stated that she has been taking Tylenol, Ibuprofen, and Advil with minima relief. Stated that she does have history of seizures, but has not been taking any of her medication for the past couple of months. Reported that she was being seen by her neurologist, but has not been seen in a while - reported that she was in [place]. 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 Denied sudden loss of vision, numbness, tingling, urinary issues, fall, head injury, changes to BM, syncope.  PCP Dr. Rollene Rotunda   Past Medical History  Diagnosis Date  . Hypertension   . Urinary tract infection   . Tumor cells, benign   . Fibromyalgia 2013  . Seizures    Past Surgical History  Procedure Laterality Date  . Tonsillectomy    . Back surgery    . Shoulder surgery     Family History  Problem Relation Age of Onset  . Heart disease Father     CAD at age 25  . Heart disease Daughter     MVP   History  Substance Use Topics  . Smoking status: Never Smoker   . Smokeless tobacco: Not on file  . Alcohol Use: No   OB History    Grav Para Term Preterm Abortions TAB SAB Ect Mult Living                 Review of Systems  Constitutional: Positive for fever (Subjective). Negative for chills.  Eyes: Positive for visual disturbance. Negative for photophobia.  Respiratory: Negative for chest tightness and shortness of breath.   Cardiovascular: Negative for chest pain.  Gastrointestinal: Positive for nausea and vomiting. Negative for abdominal pain.  Musculoskeletal: Positive for neck pain. Negative for back pain.  Neurological: Positive for headaches. Negative for weakness and numbness.      Allergies  Codeine and Magnesium-containing compounds  Home Medications   Prior to Admission medications   Medication Sig Start Date End Date Taking? Authorizing Provider  acetaminophen (TYLENOL) 325 MG tablet Take 650 mg by mouth every 6 (six) hours as needed.   Yes Historical Provider, MD  ALPRAZolam Duanne Moron) 0.5 MG tablet Take 0.5 mg by mouth 3 (three) times daily as needed for anxiety.   Yes Historical Provider, MD  aspirin EC 81 MG tablet Take 81 mg by mouth daily.   Yes Historical Provider, MD  lisinopril (PRINIVIL,ZESTRIL) 5 MG tablet Take 5 mg by mouth daily.   Yes Historical Provider, MD  traMADol (ULTRAM) 50 MG tablet Take 50 mg by mouth every 6 (six) hours as needed.   Yes Historical Provider, MD  cyclobenzaprine (FLEXERIL) 10 MG tablet Take 1 tablet (10 mg total) by mouth 2 (two) times daily as needed for muscle spasms. 08/22/13   Aubrina Nieman, PA-C  levETIRAcetam (KEPPRA) 500 MG tablet Take 1 tablet (500 mg total) by mouth 2 (two) times daily. 08/22/13   Jerrid Forgette, PA-C   BP 122/60  Pulse 74  Temp(Src) 99.4 F (37.4 C) (Oral)  Resp 18  SpO2 95%  LMP 07/31/2013 Physical Exam  Nursing note and vitals reviewed. Constitutional: She is oriented to person, place, and time. She appears well-developed and well-nourished. No distress.  Patient found laying in bed sitting comfortably   HENT:  Head:  Normocephalic and atraumatic.  Mouth/Throat: Oropharynx is clear and moist. No oropharyngeal exudate.  Eyes: Conjunctivae and EOM are normal. Pupils are equal, round, and reactive to light. Right eye exhibits no discharge. Left eye exhibits no discharge.  Horizontal nystagmus noted  Neck: Normal range of motion. Neck supple. No tracheal deviation present.  Negative neck stiffness Negative nuchal rigidity Negative cervical lymphadenopathy Negative meningeal signs  Cardiovascular: Normal rate, regular rhythm and normal heart sounds.  Exam reveals no friction rub.   No murmur heard. Pulses:      Radial pulses are 2+ on the right side, and 2+ on the left side.       Dorsalis pedis pulses are 2+ on the right side, and 2+ on the left side.  Pulmonary/Chest: Effort normal and breath sounds normal. No respiratory distress. She has no wheezes. She has no rales.  Musculoskeletal: Normal range of motion.  Full ROM to upper and lower extremities without difficulty noted, negative ataxia noted.  Lymphadenopathy:    She has no cervical adenopathy.  Neurological: She is alert and oriented to person, place, and time. No cranial nerve deficit. She exhibits normal muscle tone. Coordination normal.  Cranial nerves III-XII grossly intact Strength 5+/5+ to upper and lower extremities bilaterally with resistance applied, equal distribution noted Equal grip strength Negative facial droop Negative slurred speech Negative aphasia Patient follows commands well Patient is able to bring finger to nose bilaterally without difficulty or ataxia noted with motion Negative arm drift Fine motor skills intact Heel to knee down shin normal bilaterally Gait proper, proper balance - negative sway, negative drift, negative step-offs  Skin: Skin is warm and dry. No rash noted. She is not diaphoretic. No erythema.  Psychiatric: She has a normal mood and affect. Her behavior is normal. Thought content normal.    ED Course   Procedures (including critical care time)  7:57 AM Patient seen and assessed by attending physician, Dr. Margaretha Seeds who cleared patient for discharge.   8:23 AM Patient re-assessed. Reported that her headache is not any better.   8:54 AM This provider spoke with Dr. Doy Mince - Neurology - discussed case, history, imaging in great detail. Neurology to consult.   11:13 AM This provider spoke with Dr. Doy Mince who saw and assessed patient. As per provider, reported that she believes this is more muscular in nature. Reported that patient will be getting IV keppra and Toradol. Physician cleared patient for discharge and for patient to be discharged home with muscle relaxer. Recommended patient to be followed by Neurology as an outpatient.   Results for orders placed during the hospital encounter of 08/22/13  POC URINE PREG, ED      Result Value Ref Range   Preg Test, Ur NEGATIVE  NEGATIVE    Labs Review Labs Reviewed  POC URINE PREG, ED  Imaging Review Ct Head Wo Contrast  08/22/2013   CLINICAL DATA:  Headache for 3 days.  EXAM: CT HEAD WITHOUT CONTRAST  TECHNIQUE: Contiguous axial images were obtained from the base of the skull through the vertex without intravenous contrast.  COMPARISON:  05/06/2006.  FINDINGS: The ventricles are normal in size and configuration. No extra-axial fluid collections are identified. The gray-white differentiation is normal. No CT findings for acute intracranial process such as hemorrhage or infarction. No mass lesions. The brainstem and cerebellum are grossly normal.  The bony structures are intact. The paranasal sinuses and mastoid air cells are clear except for left maxillary sinus disease and scattered ethmoid air cell disease. The globes are intact.  IMPRESSION: No acute intracranial findings or mass lesion.  Scattered ethmoid and left maxillary sinus disease.   Electronically Signed   By: Kalman Jewels M.D.   On: 08/22/2013 06:50     EKG  Interpretation None      MDM   Final diagnoses:  Migraine  Muscle strain   Medications  ondansetron (ZOFRAN) injection 4 mg (4 mg Intravenous Given 08/22/13 0404)  fentaNYL (SUBLIMAZE) injection 50 mcg (50 mcg Intravenous Given 08/22/13 0404)  metoCLOPramide (REGLAN) injection 10 mg (10 mg Intravenous Given 08/22/13 0815)  diphenhydrAMINE (BENADRYL) injection 12.5 mg (12.5 mg Intravenous Given 08/22/13 0815)  sodium chloride 0.9 % bolus 1,000 mL (0 mLs Intravenous Stopped 08/22/13 1223)  levETIRAcetam (KEPPRA) 1,000 mg in sodium chloride 0.9 % 100 mL IVPB (1,000 mg Intravenous New Bag/Given 08/22/13 1217)  ketorolac (TORADOL) 30 MG/ML injection 30 mg (30 mg Intravenous Given 08/22/13 1217)   Filed Vitals:   08/22/13 1000 08/22/13 1030 08/22/13 1100 08/22/13 1338  BP: 144/80 142/86 145/81 122/60  Pulse: 73 95 85 74  Temp:      TempSrc:      Resp:    18  SpO2: 100% 100% 100% 95%    This provider reviewed the patient's chart. Patient has had numerous CT scans of the head and MRI. MRI performed in 2010 with negative acute abnormalities. The only CT head that showed any type of density was in 2006 where a 10 x 12 mm CSF density structure was found along the medial left temporal lobe that is likely to represent a choroidal fissure or neuroepithelial/at arachnoid cyst. Patient has had countless CT head scans since this point with negative findings of the structure. Urine pregnancy negative. CT head negative for acute intracranial abnormalities or mass lesions. Scattered ethmoid and left maxillary sinus disease noted. Doubt SAH. Doubt ICH. Doubt meningitis. Doubt CVA. Suspicion to be migraine and tension/muscular discomfort. Patient seen and assessed by neurology who recommended patient to be discharged home - discharged with Keppra 500 mg BID and muscle relaxer. Patient was given loading dose of Keppra while in the ED setting. Negative focal neurological deficits noted. Patient stable, afebrile. Patient  not septic appearing. Discharged patient. Referred patient to PCP and Neurology. Discussed with patient to avoid any physical or strenuous activity. Discussed with patient to closely monitor symptoms and if symptoms are to worsen or change to report back to the ED - strict return instructions given.  Patient agreed to plan of care, understood, all questions answered.   Jamse Mead, PA-C 08/22/13 1743

## 2013-08-22 NOTE — Consult Note (Addendum)
Reason for Consult:Headache Referring Physician: Kathrynn Humble  CC: Headache  HPI: Suzanne Velazquez is an 43 y.o. female who reports that on Saturday of last week while out at dinner she had the acute onset of a headache at about 10p.  The headache was initially in the left temporal region and in the neck.  It has continued since that time but is just in the neck.  She rates the pain at a 10/10.  She has been to the ED previously and received a headache cocktail without improvement.  She has associated nausea and has had some vomiting as well.  There has been some photophobia but no phonophobia. She has had no visual changes.  There have been no cognitive changes.  Today the patient felt that her headache was worse as she experienced dizziness and her blood pressure was elevated.   Patient has a history of headaches but they are usually not this severe.  Patient also has a history of seizures but has not been on her medications for a few months.  Her seizures usually start with a headache and a smell of alcohol.    Past Medical History  Diagnosis Date  . Hypertension   . Urinary tract infection   . Tumor cells, benign   . Fibromyalgia 2013  . Seizures     Past Surgical History  Procedure Laterality Date  . Tonsillectomy    . Back surgery    . Shoulder surgery      Family History  Problem Relation Age of Onset  . Heart disease Father     CAD at age 88  . Heart disease Daughter     MVP    Social History:  reports that she has never smoked. She does not have any smokeless tobacco history on file. She reports that she does not drink alcohol or use illicit drugs.  Allergies  Allergen Reactions  . Codeine Itching  . Magnesium-Containing Compounds Swelling    Medications: I have reviewed the patient's current medications. Prior to Admission:  Current outpatient prescriptions: acetaminophen (TYLENOL) 325 MG tablet, Take 650 mg by mouth every 6 (six) hours as needed., Disp: , Rfl: ;    ALPRAZolam (XANAX) 0.5 MG tablet, Take 0.5 mg by mouth 3 (three) times daily as needed for anxiety., Disp: , Rfl: ;   aspirin EC 81 MG tablet, Take 81 mg by mouth daily., Disp: , Rfl: ;   lisinopril (PRINIVIL,ZESTRIL) 5 MG tablet, Take 5 mg by mouth daily., Disp: , Rfl:  traMADol (ULTRAM) 50 MG tablet, Take 50 mg by mouth every 6 (six) hours as needed., Disp: , Rfl:   ROS: History obtained from the patient  General ROS: negative for - chills, fatigue, fever, night sweats, weight gain or weight loss Psychological ROS: negative for - behavioral disorder, hallucinations, memory difficulties, mood swings or suicidal ideation Ophthalmic ROS: legally blind ENT ROS: negative for - epistaxis, nasal discharge, oral lesions, sore throat, tinnitus or vertigo Allergy and Immunology ROS: negative for - hives or itchy/watery eyes Hematological and Lymphatic ROS: negative for - bleeding problems, bruising or swollen lymph nodes Endocrine ROS: negative for - galactorrhea, hair pattern changes, polydipsia/polyuria or temperature intolerance Respiratory ROS: episodes of shortness of breath  Cardiovascular ROS: negative for - chest pain, dyspnea on exertion, edema or irregular heartbeat Gastrointestinal ROS: as noted in HPI Genito-Urinary ROS: negative for - dysuria, hematuria, incontinence or urinary frequency/urgency Musculoskeletal ROS: negative for - joint swelling or muscular weakness Neurological ROS: as noted  in HPI Dermatological ROS: negative for rash and skin lesion changes  Physical Examination: Blood pressure 144/80, pulse 73, temperature 99.4 F (37.4 C), temperature source Oral, resp. rate 15, last menstrual period 07/31/2013, SpO2 100.00%. Cervical: Pain on palpation of the occipital notches bilaterally, along the cervical paraspinals muscles and into the shoulders.  Neurologic Examination Mental Status: Alert, oriented, thought content appropriate.  Speech fluent without evidence of  aphasia.  Able to follow 3 step commands without difficulty. Cranial Nerves: II: Discs flat bilaterally; Visual fields grossly normal, pupils equal, round, reactive to light and accommodation III,IV, VI: ptosis not present, extra-ocular motions intact bilaterally V,VII: smile symmetric, facial light touch sensation normal bilaterally VIII: hearing normal bilaterally IX,X: gag reflex present XI: bilateral shoulder shrug XII: midline tongue extension Motor: Right : Upper extremity   5/5    Left:     Upper extremity   5/5  Lower extremity   5/5     Lower extremity   5/5 Tone and bulk:normal tone throughout; no atrophy noted Sensory: Pinprick and light touch intact throughout, bilaterally Deep Tendon Reflexes: 2+ and symmetric throughout Plantars: Right: downgoing   Left: downgoing Cerebellar: normal finger-to-nose and normal heel-to-shin test Gait: Unable to  Test secondary to patient felling so poorly CV: pulses palpable throughout     Laboratory Studies:   Basic Metabolic Panel: No results found for this basename: NA, K, CL, CO2, GLUCOSE, BUN, CREATININE, CALCIUM, MG, PHOS,  in the last 168 hours  Liver Function Tests: No results found for this basename: AST, ALT, ALKPHOS, BILITOT, PROT, ALBUMIN,  in the last 168 hours No results found for this basename: LIPASE, AMYLASE,  in the last 168 hours No results found for this basename: AMMONIA,  in the last 168 hours  CBC: No results found for this basename: WBC, NEUTROABS, HGB, HCT, MCV, PLT,  in the last 168 hours  Cardiac Enzymes: No results found for this basename: CKTOTAL, CKMB, CKMBINDEX, TROPONINI,  in the last 168 hours  BNP: No components found with this basename: POCBNP,   CBG: No results found for this basename: GLUCAP,  in the last 168 hours  Microbiology: Results for orders placed during the hospital encounter of 12/17/12  URINE CULTURE     Status: None   Collection Time    12/17/12  8:22 PM      Result Value  Ref Range Status   Specimen Description URINE, CLEAN CATCH   Final   Special Requests NONE   Final   Culture  Setup Time     Final   Value: 12/18/2012 02:35     Performed at [facility]     Final   Value: 30,000 COLONIES/ML     Performed at [facility]   Culture     Final   Value: Multiple bacterial morphotypes present, none predominant. Suggest appropriate recollection if clinically indicated.     Performed at [facility]   Report Status 12/19/2012 FINAL   Final  WET PREP, GENITAL     Status: Abnormal   Collection Time    12/17/12  9:28 PM      Result Value Ref Range Status   Yeast Wet Prep HPF POC FEW (*) NONE SEEN Final   Trich, Wet Prep NONE SEEN  NONE SEEN Final   Clue Cells Wet Prep HPF POC FEW (*) NONE SEEN Final   WBC, Wet Prep HPF POC TOO NUMEROUS TO COUNT (*) NONE SEEN Final  GC/CHLAMYDIA PROBE AMP     Status: None   Collection Time    12/17/12  9:28 PM      Result Value Ref Range Status   CT Probe RNA NEGATIVE  NEGATIVE Final   GC Probe RNA NEGATIVE  NEGATIVE Final   Comment: (NOTE)                                                                                              Normal Reference Range: Negative          Assay performed using the Gen-Probe APTIMA COMBO2 (R) Assay.     Acceptable specimen types for this assay include APTIMA Swabs (Unisex,     endocervical, urethral, or vaginal), first void urine, and ThinPrep     liquid based cytology samples.     Performed at [facility]    Coagulation Studies: No results found for this basename: LABPROT, INR,  in the last 72 hours  Urinalysis: No results found for this basename: COLORURINE, APPERANCEUR, LABSPEC, PHURINE, GLUCOSEU, HGBUR, BILIRUBINUR, KETONESUR, PROTEINUR, UROBILINOGEN, NITRITE, LEUKOCYTESUR,  in the last 168 hours  Lipid Panel:  No results found for this basename: chol, trig, hdl, cholhdl, vldl, ldlcalc    HgbA1C:  No results found for this  basename: HGBA1C    Urine Drug Screen:      Component Value Date/Time   LABOPIA NONE DETECTED 05/03/2009 1001   COCAINSCRNUR NONE DETECTED 05/03/2009 1001   LABBENZ NONE DETECTED 05/03/2009 1001   AMPHETMU NONE DETECTED 05/03/2009 1001   THCU NONE DETECTED 05/03/2009 1001   LABBARB  Value: NONE DETECTED        DRUG SCREEN FOR MEDICAL PURPOSES ONLY.  IF CONFIRMATION IS NEEDED FOR ANY PURPOSE, NOTIFY LAB WITHIN 5 DAYS.        LOWEST DETECTABLE LIMITS FOR URINE DRUG SCREEN Drug Class       Cutoff (ng/mL) Amphetamine      1000 Barbiturate      200 Benzodiazepine   875 Tricyclics       643 Opiates          300 Cocaine          300 THC              50 05/03/2009 1001    Alcohol Level: No results found for this basename: ETH,  in the last 168 hours   Imaging: Ct Head Wo Contrast  08/22/2013   CLINICAL DATA:  Headache for 3 days.  EXAM: CT HEAD WITHOUT CONTRAST  TECHNIQUE: Contiguous axial images were obtained from the base of the skull through the vertex without intravenous contrast.  COMPARISON:  05/06/2006.  FINDINGS: The ventricles are normal in size and configuration. No extra-axial fluid collections are identified. The gray-white differentiation is normal. No CT findings for acute intracranial process such as hemorrhage or infarction. No mass lesions. The brainstem and cerebellum are grossly normal.  The bony structures are intact. The paranasal sinuses and mastoid air cells are clear except for left maxillary sinus disease and scattered ethmoid air cell disease. The globes are intact.  IMPRESSION: No acute intracranial  findings or mass lesion.  Scattered ethmoid and left maxillary sinus disease.   Electronically Signed   By: Kalman Jewels M.D.   On: 08/22/2013 06:50     Assessment/Plan: 43 year old female with history of headache and seizure who presents with a refractory headache that has ben [present for the past week.  On neurological examination patient with muscular cervical pain that seems to  be the source of her complaints.  It is also unclear how much of this may be related to noncompliance with anticonvulsant therapy. Head CT reviewed and shows no acute changes.  No evidence of SAH.    Recommendations: 1.  Keppra 1000mg  IV now.  To be discharged with a prescription for 500mg  BID 2.  Toradol 30mg  IV now 3.  Muscle relaxer prescription for prn home use 4.  Follow up with neurology as an outpatient.  Patient wishes to be seen at [facility].  Dr. Delice Lesch is the epileptologist there.  Alexis Goodell, MD [facility] [phone] 08/22/2013, 11:14 AM

## 2013-08-22 NOTE — Discharge Instructions (Signed)
Please call your doctor for a followup appointment within 24-48 hours. When you talk to your doctor please let them know that you were seen in the emergency department and have them acquire all of your records so that they can discuss the findings with you and formulate a treatment plan to fully care for your new and ongoing problems. Please call and set-up an appointment with your primary care provider to be seen and re-assessed Please call and set-up an appointment with [facility] Please take medications as prescribed While on muscle relaxers, these medications can cause drowsiness - please do not take when driving or operating any heavy machinery  Please continue to monitor symptoms closely and if symptoms are to worsen or change (fever greater than 101, chills, chest pain, shortness of breath, difficulty breathing, numbness, tingling, blurred vision, sudden loss of vision, fall, head injury, confusion, dizziness, weakness, nausea, vomiting, diarrhea, stomach pain) please report back to the ED immediately  Migraine Headache A migraine headache is an intense, throbbing pain on one or both sides of your head. A migraine can last for 30 minutes to several hours. CAUSES  The exact cause of a migraine headache is not always known. However, a migraine may be caused when nerves in the brain become irritated and release chemicals that cause inflammation. This causes pain. Certain things may also trigger migraines, such as:  Alcohol.  Smoking.  Stress.  Menstruation.  Aged cheeses.  Foods or drinks that contain nitrates, glutamate, aspartame, or tyramine.  Lack of sleep.  Chocolate.  Caffeine.  Hunger.  Physical exertion.  Fatigue.  Medicines used to treat chest pain (nitroglycerine), birth control pills, estrogen, and some blood pressure medicines. SIGNS AND SYMPTOMS  Pain on one or both sides of your head.  Pulsating or throbbing pain.  Severe pain that prevents daily  activities.  Pain that is aggravated by any physical activity.  Nausea, vomiting, or both.  Dizziness.  Pain with exposure to bright lights, loud noises, or activity.  General sensitivity to bright lights, loud noises, or smells. Before you get a migraine, you may get warning signs that a migraine is coming (aura). An aura may include:  Seeing flashing lights.  Seeing bright spots, halos, or zig-zag lines.  Having tunnel vision or blurred vision.  Having feelings of numbness or tingling.  Having trouble talking.  Having muscle weakness. DIAGNOSIS  A migraine headache is often diagnosed based on:  Symptoms.  Physical exam.  A CT scan or MRI of your head. These imaging tests cannot diagnose migraines, but they can help rule out other causes of headaches. TREATMENT Medicines may be given for pain and nausea. Medicines can also be given to help prevent recurrent migraines.  HOME CARE INSTRUCTIONS  Only take over-the-counter or prescription medicines for pain or discomfort as directed by your health care provider. The use of long-term narcotics is not recommended.  Lie down in a dark, quiet room when you have a migraine.  Keep a journal to find out what may trigger your migraine headaches. For example, write down:  What you eat and drink.  How much sleep you get.  Any change to your diet or medicines.  Limit alcohol consumption.  Quit smoking if you smoke.  Get 7 9 hours of sleep, or as recommended by your health care provider.  Limit stress.  Keep lights dim if bright lights bother you and make your migraines worse. SEEK IMMEDIATE MEDICAL CARE IF:   Your migraine becomes severe.  You have a fever.  You have a stiff neck.  You have vision loss.  You have muscular weakness or loss of muscle control.  You start losing your balance or have trouble walking.  You feel faint or pass out.  You have severe symptoms that are different from your first  symptoms. MAKE SURE YOU:   Understand these instructions.  Will watch your condition.  Will get help right away if you are not doing well or get worse. Document Released: 03/05/2005 Document Revised: 12/24/2012 Document Reviewed: 11/10/2012 [facility] Patient Information 2014 [place].   Myalgia, Adult Myalgia is the medical term for muscle pain. It is a symptom of many things. Nearly everyone at some time in their life has this. The most common cause for muscle pain is overuse or straining and more so when you are not in shape. Injuries and muscle bruises cause myalgias. Muscle pain without a history of injury can also be caused by a virus. It frequently comes along with the flu. Myalgia not caused by muscle strain can be present in a large number of infectious diseases. Some autoimmune diseases like lupus and fibromyalgia can cause muscle pain. Myalgia may be mild, or severe. SYMPTOMS  The symptoms of myalgia are simply muscle pain. Most of the time this is short lived and the pain goes away without treatment. DIAGNOSIS  Myalgia is diagnosed by your caregiver by taking your history. This means you tell him when the problems began, what they are, and what has been happening. If this has not been a long term problem, your caregiver may want to watch for a while to see what will happen. If it has been long term, they may want to do additional testing. TREATMENT  The treatment depends on what the underlying cause of the muscle pain is. Often anti-inflammatory medications will help. HOME CARE INSTRUCTIONS  If the pain in your muscles came from overuse, slow down your activities until the problems go away.  Myalgia from overuse of a muscle can be treated with alternating hot and cold packs on the muscle affected or with cold for the first couple days. If either heat or cold seems to make things worse, stop their use.  Apply ice to the sore area for 15-20 minutes, 03-04 times per day, while  awake for the first 2 days of muscle soreness, or as directed. Put the ice in a plastic bag and place a towel between the bag of ice and your skin.  Only take over-the-counter or prescription medicines for pain, discomfort, or fever as directed by your caregiver.  Regular gentle exercise may help if you are not active.  Stretching before strenuous exercise can help lower the risk of myalgia. It is normal when beginning an exercise regimen to feel some muscle pain after exercising. Muscles that have not been used frequently will be sore at first. If the pain is extreme, this may mean injury to a muscle. SEEK MEDICAL CARE IF:  You have an increase in muscle pain that is not relieved with medication.  You begin to run a temperature.  You develop nausea and vomiting.  You develop a stiff and painful neck.  You develop a rash.  You develop muscle pain after a tick bite.  You have continued muscle pain while working out even after you are in good condition. SEEK IMMEDIATE MEDICAL CARE IF: Any of your problems are getting worse and medications are not helping. MAKE SURE YOU:   Understand these instructions.  Will watch your condition.  Will get help right away if you are not doing well or get worse. Document Released: 01/25/2006 Document Revised: 05/28/2011 Document Reviewed: 04/16/2006 [facility] Patient Information 2014 [place], [place].

## 2013-08-22 NOTE — ED Notes (Signed)
PA at bedside.

## 2013-08-22 NOTE — ED Notes (Signed)
Pt c/o of left temporal HA x 3 days that has intensified; A&O X 4; pt has c/o blurred vision and intermit nausea; Pt c/o of dizziness on exertion. Hx left side benign brain tumor

## 2013-08-24 NOTE — ED Provider Notes (Signed)
Medical screening examination/treatment/procedure(s) were conducted as a shared visit with non-physician practitioner(s) and myself.  I personally evaluated the patient during the encounter.   EKG Interpretation None     Pt seen by me recently for headache, reports no change in headache, has not yet had follow up.  Pt is worried that abnormality seen on CT in remote past may be causing her problems.  CT scan today unremarkable.  Will give headache cocktail, muscle relaxant and have her f/u with neuro/headache clinic.  Kalman Drape, MD 08/24/13 [phone]

## 2013-08-26 ENCOUNTER — Ambulatory Visit: Payer: Medicaid Other | Admitting: Neurology

## 2013-09-02 ENCOUNTER — Ambulatory Visit: Payer: Medicaid Other | Admitting: Neurology

## 2013-09-08 ENCOUNTER — Encounter: Payer: Self-pay | Admitting: Neurology

## 2013-09-08 ENCOUNTER — Ambulatory Visit (INDEPENDENT_AMBULATORY_CARE_PROVIDER_SITE_OTHER): Payer: Medicaid Other | Admitting: Neurology

## 2013-09-08 VITALS — BP 130/76 | HR 87 | Ht 63.0 in | Wt 188.5 lb

## 2013-09-08 DIAGNOSIS — R519 Headache, unspecified: Secondary | ICD-10-CM

## 2013-09-08 DIAGNOSIS — R569 Unspecified convulsions: Secondary | ICD-10-CM | POA: Insufficient documentation

## 2013-09-08 DIAGNOSIS — R51 Headache: Secondary | ICD-10-CM

## 2013-09-08 MED ORDER — LEVETIRACETAM 500 MG PO TABS: ORAL_TABLET | ORAL | Status: DC

## 2013-09-08 MED ORDER — PREDNISONE 10 MG PO TABS: ORAL_TABLET | ORAL | Status: DC

## 2013-09-08 NOTE — Progress Notes (Signed)
NEUROLOGY CONSULTATION NOTE  Suzanne Velazquez MRN: [id number] DOB: 1970/09/30  Referring provider: Dr. Alexis Goodell Primary care provider: Dr. Vista Lawman  Reason for consult:  Seizures and headaches  Dear Dr Doy Mince:  Thank you for your kind referral of Suzanne Velazquez for consultation of the above symptoms. Although her history is well known to you, please allow me to reiterate it for the purpose of our medical record. Records and images were personally reviewed where available.  HISTORY OF PRESENT ILLNESS: This is a 43 year old right-handed woman with a history of hypertension, seizures, and headaches, presenting to establish care for the above symptoms.  1. Seizures:  She started having seizures in 2006.  Seizures would start would a bad headache that would get intense quickly with sharp pain over the left temporal region, occasional nausea, then she has to lie down and could not function.  She reports that she would then have a seizure, where she starts feeling a little shaky like she would faint, sees white spots in her vision, smells an alcohol ("like rubbing alcohol") smell, then has no recollection of events.  Family has told her she would become unresponsive, her right arm or leg would extend up, followed by violent shaking for 30-45 seconds.  She feels diffusely sore after, no focal weakness.  She hit her head one time, otherwise no other injuries/tongue bite/incontinence.  She has not had any shaking episodes since June 2014, however she continues to have smaller episodes where she has the feeling like it's going to happen with the alcohol smell, but does not progress.  She would usually sit and calm herself down, lasting 1-2 minutes. In the past she was told by her daughter that she would have staring and unresponsive episodes, however since this daughter moved to [place], she is unaware of any further episodes, her other 2 children have not mentioned anything similar.  She  used to go to [facility], but had difficulties with transportation and ran out of Keppra refills 6 months ago.  In the past she recalls trying Topamax, Depakote, Lamictal, ?Tegretol with no effect.  Once she started Keppra, seizure frequency became much less.    Epilepsy Risk Factors:  Her maternal cousin had seizures since childhood, her son has autism and intractable epilepsy.  She collapsed in the 4th grade but has had no further similar symptoms until 2006.  She graduated high school in regular classes.  There is no history of febrile convulsions, CNS infections, significant traumatic brain injuries, or neurosurgical procedures.  2. Headaches:  She recalls having headaches for the past 20 years.  She reports "there is not a single day that I am not in pain."  She has been to the ER a couple of times a month for a period of 10 years due to intense pain and has had multiple head imaging studies. Headaches are usually a throbbing aggravating 4/10 pain over the left temporal region, waxing and waning in intensity up to a 10/10 with pain in the base of the neck.  She used to take 6-8 tablets of Advil daily, but recently has reduced this to 4 tabs/day in addition to Flexeril.  There is some nausea and photophobia with the headaches.  She denies any focal numbness/tingling/weakness, back pain, diplopia, blurred vision, dysarthria/dysphagia.   . She reports occasional difficulty breathing, "like I can't get a complete breath."  This sensation intensifies when she has the seizures but can also occur separately.  She  has urinary frequency and will be seeing a urologist.  She takes Xanax prn for anxiety.  She has not taken Tramadol because this does not help and actually makes her headache worse.  She recalls trying amitriptyline, nortriptyline, ?Effexor for the headaches in the past with minimal effect.  She reports her short-term memory is bad, she forgets important thing such as appointments,  events for her children.  She does endorse significant stress taking care of her child with autism. 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    She went to [facility] ER 2 weeks ago due to severe headache for a week. She was noted to have tenderness to palpation over the occipital notches and cervical paraspinal muscles into the shoulders. Head CT unremarkable. Symptoms were felt to be due to muscular cervical pain. She was also given an IV load of Keppra and a prescription for Keppra 500mg  BID which she has not refilled. She recalls taking Keppra 1000mg  BID with her prior neurologist until she ran out 6 months ago.  Diagnostic Data: I personally reviewed head CT done 08/22/13 which was unremarkable.  She had a head CT in 2004 with note of fluid collection in the left medial temporal lobe.  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  Imaging studies unavailable for review.  Her most recent MRI brain with and without contrast in 2010 reported as normal, on my review, the cystic structure on the left medial temporal lobe is noted with no abnormal enhancement.  PAST MEDICAL HISTORY: Past Medical History  Diagnosis Date  . Hypertension   . Urinary tract infection   . Tumor cells, benign   . Fibromyalgia 2013  . Seizures     PAST SURGICAL HISTORY: Past Surgical History  Procedure Laterality Date  . Tonsillectomy    . Back surgery    . Shoulder surgery      MEDICATIONS: Current Outpatient Prescriptions on File Prior to Visit  Medication Sig Dispense Refill  . acetaminophen (TYLENOL) 325 MG tablet Take 650 mg by mouth every 6 (six) hours as needed.      . ALPRAZolam (XANAX) 0.5 MG tablet Take 0.5 mg by mouth 3 (three) times daily as needed for anxiety.      Marland Kitchen aspirin EC 81 MG tablet Take 81 mg by mouth daily.      . cyclobenzaprine (FLEXERIL) 10 MG tablet Take 1 tablet (10 mg total) by mouth 2  (two) times daily as needed for muscle spasms.  20 tablet  0  . lisinopril (PRINIVIL,ZESTRIL) 5 MG tablet Take 5 mg by mouth daily.      . traMADol (ULTRAM) 50 MG tablet Take 50 mg by mouth every 6 (six) hours as needed.       No current facility-administered medications on file prior to visit.    ALLERGIES: Allergies  Allergen Reactions  . Codeine Itching  . Magnesium-Containing Compounds Swelling    FAMILY HISTORY: Family History  Problem Relation Age of Onset  . Heart disease Father     CAD at age 5  . Heart disease Daughter     MVP    SOCIAL HISTORY: History   Social History  . Marital Status: Legally Separated    Spouse Name: N/A    Number of Children: 4  . Years of Education: N/A   Occupational History  . UNEMPLOYED    Social History  Main Topics  . Smoking status: Never Smoker   . Smokeless tobacco: Not on file  . Alcohol Use: No  . Drug Use: No  . Sexual Activity: Not on file   Other Topics Concern  . Not on file   Social History Narrative  . No narrative on file    REVIEW OF SYSTEMS: Constitutional: No fevers, chills, or sweats, no generalized fatigue, change in appetite Eyes: No visual changes, double vision, eye pain Ear, nose and throat: No hearing loss, ear pain, nasal congestion, sore throat Cardiovascular: No chest pain, palpitations Respiratory:  +shortness of breath at rest or with exertion, no wheezes GastrointestinaI: No nausea, vomiting, diarrhea, abdominal pain, fecal incontinence Genitourinary:  No dysuria, urinary retention . +frequency Musculoskeletal:  No neck pain, back pain Integumentary: No rash, pruritus, skin lesions Neurological: as above Psychiatric: No depression, insomnia, anxiety Endocrine: No palpitations, fatigue, diaphoresis, mood swings, change in appetite, change in weight, increased thirst Hematologic/Lymphatic:  No anemia, purpura, petechiae. Allergic/Immunologic: no itchy/runny eyes, nasal congestion, recent  allergic reactions, rashes  PHYSICAL EXAM: Filed Vitals:   09/08/13 0948  BP: 130/76  Pulse: 87   General: No acute distress Head:  Normocephalic/atraumatic Eyes: Fundoscopic exam shows bilateral sharp discs, no vessel changes, exudates, or hemorrhages Neck: supple, no paraspinal tenderness, full range of motion Back: No paraspinal tenderness Heart: regular rate and rhythm Lungs: Clear to auscultation bilaterally. Vascular: No carotid bruits. Skin/Extremities: No rash, no edema Neurological Exam: Mental status: alert and oriented to person, place, and time, no dysarthria or aphasia, Fund of knowledge is appropriate.  Recent and remote memory are intact.  Attention and concentration are normal.    Able to name objects and repeat phrases. Cranial nerves: CN I: not tested CN II: pupils equal, round and reactive to light, but more sluggish on left pupil. VA 20/400 on right, counting fingers on left.  visual fields intact, fundi unremarkable. CN III, IV, VI:  full range of motion, no nystagmus, no ptosis CN V: facial sensation intact CN VII: upper and lower face symmetric CN VIII: hearing intact to finger rub CN IX, X: gag intact, uvula midline CN XI: sternocleidomastoid and trapezius muscles intact CN XII: tongue midline Bulk & Tone: normal, no fasciculations. Motor: 5/5 throughout with no pronator drift. Sensation: intact to light touch, cold, pin, vibration and joint position sense.  No extinction to double simultaneous stimulation.  Romberg test negative Deep Tendon Reflexes: +2 throughout except for bilateral +1 ankle jerks, no ankle clonus Plantar responses: downgoing bilaterally Cerebellar: no incoordination on finger to nose, heel to shin. No dysdiadochokinesia Gait: narrow-based and steady, able to tandem walk adequately. 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  She will restart Keppra 1000mg  BID.  She also reports daily headaches, as well as headaches as part of her aura.  A routine EEG will be ordered, followed by a 48-hour EEG to assess for nonconvulsive seizures contributing to headaches.  She will take a course of prednisone to hopefully break the headache cycle. 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  She  also reports headaches in the cervical/occipital region, cervicogenic headaches may also play a role, check cervical xray. She may benefit from physical therapy for myofascial release.  She will keep a headache calendar and knows to minimize use of rescue medications to 2-3/week to avoid rebound headaches.There is some evidence that Keppra can help with headaches. If she continues to have headaches after a good trial of Keppra, considerations for adding zonisamide for headache and seizure prophylaxis will be done.  [place] driving laws were discussed with the patient, she is not driving.    She will follow-up in 2 months.  Thank you for allowing me to participate in the care of this patient. Please do not hesitate to call for any questions or concerns.   Ellouise Newer, M.D.  CC: Dr. Orma Render

## 2013-09-08 NOTE — Patient Instructions (Addendum)
1. Restart Keppra 1000mg : Take 1/2 tab twice a day for 3 days, then 1 tab twice a day 2. Take Prednisone 10mg : Take 6 tabs on day 1, 5 tabs on day 2, 4 tabs on day 3, 3 tabs on day 4, 2 tabs on days 5 and 6, 1 tab on days 7 and 8 3. Cervical xray 4. Routine EEG, then 48-hour EEG 5. Minimize intake of Advil or any rescue medication to 2-3 a week to avoid rebound headaches  Seizure Precautions: 1. If medication has been prescribed for you to prevent seizures, take it exactly as directed.  Do not stop taking the medicine without talking to your doctor first, even if you have not had a seizure in a long time.   2. Avoid activities in which a seizure would cause danger to yourself or to others.  Don't operate dangerous machinery, swim alone, or climb in high or dangerous places, such as on ladders, roofs, or girders.  Do not drive unless your doctor says you may.  3. If you have any warning that you may have a seizure, lay down in a safe place where you can't hurt yourself.    4.  No driving for 6 months from last seizure, as per [facility].   Please refer to the following link on the [facility] website for more information: http://[email or web address].org/answerplace/Social/driving/drivingu.cfm   5.  Maintain good sleep hygiene.  6.  Notify your neurology if you are planning pregnancy or if you become pregnant.  7.  Contact your doctor if you have any problems that may be related to the medicine you are taking.  8.  Call 911 and bring the patient back to the ED if:        A.  The seizure lasts longer than 5 minutes.       B.  The patient doesn't awaken shortly after the seizure  C.  The patient has new problems such as difficulty seeing, speaking or moving  D.  The patient was injured during the seizure  E.  The patient has a temperature over 102 F (39C)  F.  The patient vomited and now is having trouble breathing

## 2013-09-09 ENCOUNTER — Other Ambulatory Visit: Payer: Self-pay | Admitting: Neurology

## 2013-09-09 NOTE — Telephone Encounter (Signed)
Ok to refill 

## 2013-09-10 NOTE — Telephone Encounter (Signed)
Ok to refill, thanks

## 2013-09-17 ENCOUNTER — Ambulatory Visit (INDEPENDENT_AMBULATORY_CARE_PROVIDER_SITE_OTHER): Payer: Medicaid Other | Admitting: Neurology

## 2013-09-17 DIAGNOSIS — R569 Unspecified convulsions: Secondary | ICD-10-CM

## 2013-09-17 DIAGNOSIS — R51 Headache: Secondary | ICD-10-CM

## 2013-09-17 DIAGNOSIS — R519 Headache, unspecified: Secondary | ICD-10-CM

## 2013-09-22 NOTE — Procedures (Signed)
ELECTROENCEPHALOGRAM REPORT  Date of Study: 09/17/2013  Patient's Name: Suzanne Velazquez MRN: [id number] Date of Birth: 03-12-1971  Referring Provider: Dr. Ellouise Newer  Clinical History: This is a 43 year old woman with a history of partial seizures that secondarily generalize, with olfactory hallucinations as aura.  She also has daily headaches and reports headaches as part of her aura.  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  Hyperventilation and photic stimulation were performed.  The digital EEG was referentially recorded, reformatted, and digitally filtered in a variety of bipolar and referential montages for optimal display.  Spike detection software was employed.  Description: The patient is awake and drowsy during the recording.  During maximal wakefulness, there is a symmetric, medium voltage 11 Hz posterior dominant rhythm that attenuates with eye opening.  The record is symmetric.  During drowsiness, there is an increase in theta slowing of the background. Hyperventilation and photic stimulation did not elicit any abnormalities.  There were no epileptiform discharges or electrographic seizures seen.    EKG lead was unremarkable.  Impression: This awake and drowsy EEG is normal.    Clinical Correlation: A normal EEG does not exclude a clinical diagnosis of epilepsy.  If further clinical questions remain, prolonged EEG may be helpful.  Clinical correlation is advised.   Ellouise Newer, M.D.

## 2013-10-19 ENCOUNTER — Other Ambulatory Visit: Payer: Medicaid Other | Admitting: Neurology

## 2013-10-19 ENCOUNTER — Telehealth: Payer: Self-pay | Admitting: Neurology

## 2013-10-19 NOTE — Telephone Encounter (Signed)
Pt called and states that she did not have a ride today and could not come in for EEG test and will call back to resch

## 2013-11-09 ENCOUNTER — Ambulatory Visit (INDEPENDENT_AMBULATORY_CARE_PROVIDER_SITE_OTHER): Payer: Medicaid Other | Admitting: Neurology

## 2013-11-09 ENCOUNTER — Encounter: Payer: Self-pay | Admitting: Neurology

## 2013-11-09 ENCOUNTER — Ambulatory Visit
Admission: RE | Admit: 2013-11-09 | Discharge: 2013-11-09 | Disposition: A | Discharge status: Home / Self Care | Payer: Medicaid Other | Source: Ambulatory Visit | Attending: Neurology | Admitting: Neurology

## 2013-11-09 VITALS — BP 110/74 | HR 77 | Ht 63.0 in | Wt 187.1 lb

## 2013-11-09 DIAGNOSIS — R519 Headache, unspecified: Secondary | ICD-10-CM

## 2013-11-09 DIAGNOSIS — M542 Cervicalgia: Secondary | ICD-10-CM

## 2013-11-09 DIAGNOSIS — R51 Headache: Secondary | ICD-10-CM

## 2013-11-09 DIAGNOSIS — R569 Unspecified convulsions: Secondary | ICD-10-CM

## 2013-11-09 MED ORDER — ZONISAMIDE 100 MG PO CAPS: ORAL_CAPSULE | ORAL | Status: DC

## 2013-11-09 MED ORDER — LEVETIRACETAM 500 MG PO TABS: 500.0000 mg | ORAL_TABLET | Freq: Two times a day (BID) | ORAL | Status: DC

## 2013-11-09 MED ORDER — RIZATRIPTAN BENZOATE 10 MG PO TBDP: 10.0000 mg | ORAL_TABLET | ORAL | Status: DC | PRN

## 2013-11-09 NOTE — Progress Notes (Signed)
NEUROLOGY FOLLOW UP OFFICE NOTE  Suzanne Velazquez [id number]  HISTORY OF PRESENT ILLNESS: I had the pleasure of seeing Suzanne Velazquez in follow-up in the neurology clinic on 11/09/2013.  The patient was last seen 2 months ago for headaches and seizures.  Records and images were personally reviewed where available.  Her routine EEG was normal.  CT done 08/22/13 was unremarkable. She had a head CT in 2004 with note of fluid collection in the left medial temporal lobe. 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 Imaging studies unavailable for review. Her most recent MRI brain with and without contrast in 2010 reported as normal, on my review, the cystic structure on the left medial temporal lobe is noted with no abnormal enhancement.   She had restarted Keppra 500mg  BID on last visit and denies any seizures or auras of alcohol smell.  She continues to have daily headaches, and has stopped Advil intake (she had been taking 4 tabs/day). She only takes prn Flexeril which makes her drowsy.  She continues to feel pressure on the left temporal region, radiating down her neck. Since her last visit, she has had 3 headaches with nausea and photophobia, but has not needed to go to the ER.  She had taken a week course of Prednisone which helped significantly, however once she stopped it, headaches returned.  She has neck pain. She denies any focal numbness/tingling/weakness, back pain, diplopia, blurred vision, dysarthria/dysphagia.   HPI:  This is a 43 year old right-handed woman with a history of hypertension, seizures, and headaches.  1. Seizures: She started having seizures in 2006. Seizures would start would a bad headache that would get intense quickly with sharp pain over the left temporal region, occasional nausea, then she has to lie down and could not function. She reports that she would then have a  seizure, where she starts feeling a little shaky like she would faint, sees white spots in her vision, smells an alcohol ("like rubbing alcohol") smell, then has no recollection of events. Family has told her she would become unresponsive, her right arm or leg would extend up, followed by violent shaking for 30-45 seconds. She feels diffusely sore after, no focal weakness. She hit her head one time, otherwise no other injuries/tongue bite/incontinence. She has not had any shaking episodes since June 2014, however she continues to have smaller episodes where she has the feeling like it's going to happen with the alcohol smell, but does not progress. She would usually sit and calm herself down, lasting 1-2 minutes. In the past she was told by her daughter that she would have staring and unresponsive episodes, however since this daughter moved to [place], she is unaware of any further episodes, her other 2 children have not mentioned anything similar. She used to go to [facility], but had difficulties with transportation and ran out of Keppra refills 6 months ago.   Prior AEDs: Topamax, Depakote, Lamictal, ?Tegretol with no effect. Once she started Keppra, seizure frequency became much less.   Epilepsy Risk Factors: Her maternal cousin had seizures since childhood, her son has autism and intractable epilepsy. She collapsed in the 4th grade but has had no further similar symptoms until 2006. She graduated high school in regular classes. There is no history of febrile convulsions, CNS infections, significant traumatic brain injuries, or neurosurgical procedures.   2. Headaches: She recalls  having headaches for the past 20 years. She reports "there is not a single day that I am not in pain." She has been to the ER a couple of times a month for a period of 10 years due to intense pain and has had multiple head imaging studies. Headaches are usually a throbbing aggravating 4/10 pain over the left  temporal region, waxing and waning in intensity up to a 10/10 with pain in the base of the neck. She used to take 6-8 tablets of Advil daily, but recently had reduced this to 4 tabs/day in addition to Flexeril. There is some nausea and photophobia with the headaches.   She has not taken Tramadol because this does not help and actually makes her headache worse. She recalls trying amitriptyline, nortriptyline, ?Effexor for the headaches in the past with minimal effect. Imitrex and Relpax made her headaches worse.  Diagnostic Data:  I personally reviewed head CT done 08/22/13 which was unremarkable. She had a head CT in 2004 with note of fluid collection in the left medial temporal lobe. 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 Imaging studies unavailable for review. Her most recent MRI brain with and without contrast in 2010 reported as normal, on my review, the cystic structure on the left medial temporal lobe is noted with no abnormal enhancement.   PAST MEDICAL HISTORY: Past Medical History  Diagnosis Date  . Hypertension   . Urinary tract infection   . Tumor cells, benign   . Fibromyalgia 2013  . Seizures     MEDICATIONS: Current Outpatient Prescriptions on File Prior to Visit  Medication Sig Dispense Refill  . acetaminophen (TYLENOL) 325 MG tablet Take 650 mg by mouth every 6 (six) hours as needed.      . ALPRAZolam (XANAX) 0.5 MG tablet Take 0.5 mg by mouth 3 (three) times daily as needed for anxiety.      Marland Kitchen aspirin EC 81 MG tablet Take 81 mg by mouth daily.      . cyclobenzaprine (FLEXERIL) 10 MG tablet Take 1 tablet (10 mg total) by mouth 2 (two) times daily as needed for muscle spasms.  20 tablet  0  . lisinopril (PRINIVIL,ZESTRIL) 5 MG tablet Take 5 mg by mouth daily.      . traMADol (ULTRAM) 50 MG tablet Take 50 mg by mouth every 6 (six) hours as needed.       No current  facility-administered medications on file prior to visit.    ALLERGIES: Allergies  Allergen Reactions  . Codeine Itching  . Magnesium-Containing Compounds Swelling    FAMILY HISTORY: Family History  Problem Relation Age of Onset  . Heart disease Father     CAD at age 43  . Heart disease Daughter     MVP    SOCIAL HISTORY: History   Social History  . Marital Status: Legally Separated    Spouse Name: N/A    Number of Children: 4  . Years of Education: N/A   Occupational History  . UNEMPLOYED    Social History Main Topics  . Smoking status: Never Smoker   . Smokeless tobacco: Not on file  . Alcohol Use: No  . Drug Use: No  . Sexual Activity: Not on file   Other Topics Concern  . Not on file   Social History Narrative  . No narrative on file    REVIEW OF  SYSTEMS: Constitutional: No fevers, chills, or sweats, no generalized fatigue, change in appetite Eyes: No visual changes, double vision, eye pain Ear, nose and throat: No hearing loss, ear pain, nasal congestion, sore throat Cardiovascular: No chest pain, palpitations Respiratory:  No shortness of breath at rest or with exertion, wheezes GastrointestinaI: No nausea, vomiting, diarrhea, abdominal pain, fecal incontinence Genitourinary:  No dysuria, urinary retention or frequency Musculoskeletal:  + neck pain, no back pain Integumentary: No rash, pruritus, skin lesions Neurological: as above Psychiatric: No depression, insomnia, anxiety Endocrine: No palpitations, fatigue, diaphoresis, mood swings, change in appetite, change in weight, increased thirst Hematologic/Lymphatic:  No anemia, purpura, petechiae. Allergic/Immunologic: no itchy/runny eyes, nasal congestion, recent allergic reactions, rashes  PHYSICAL EXAM: Filed Vitals:   11/09/13 1003  BP: 110/74  Pulse: 77   General: No acute distress Head:  Normocephalic/atraumatic Neck: supple, no paraspinal tenderness, full range of motion Heart:  Regular  rate and rhythm Lungs:  Clear to auscultation bilaterally Back: No paraspinal tenderness Skin/Extremities: No rash, no edema Neurological Exam: pupils equal, round and reactive to light, but more sluggish on left pupil (unchanged). visual fields intact, fundoscopy shows bilateral sharp discs CN III, IV, VI: full range of motion, no nystagmus, no ptosis  CN V: facial sensation intact  CN VII: upper and lower face symmetric  CN VIII: hearing intact to finger rub  CN IX, X: gag intact, uvula midline  CN XI: sternocleidomastoid and trapezius muscles intact  CN XII: tongue midline  Bulk & Tone: normal, no fasciculations.  Motor: 5/5 throughout with no pronator drift.  Sensation: intact to light touch. No extinction to double simultaneous stimulation. Romberg test negative  Deep Tendon Reflexes: +2 throughout except for bilateral +1 ankle jerks, no ankle clonus  Plantar responses: downgoing bilaterally  Cerebellar: no incoordination on finger to nose Gait: narrow-based and steady, able to tandem walk adequately.  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 Her brain imaging shows a small cyst in the left medial temporal lobe, routine EEG normal. She denies any convulsions for a year, no further simple partial seizures with olfactory aura since restarting Keppra 500mg  BID.  Continue current dose for now. She continues to have daily headaches and has stopped Advil intake.  She had pain cervical/occipital region, cervicogenic headaches may also play a role, check cervical xray. She may benefit from physical therapy for myofascial release. She will start zonisamide for headache and seizure prophylaxis, side effects were discussed, she will uptitrate slowly over the next 6 weeks, with plan to taper off Keppra once  zonisamide is therapeutic.  She will keep a headache calendar and knows to minimize use of rescue medications to 2-3/week to avoid rebound headaches. She will take prn Maxalt for rescue, side effects were discussed.  She will take 1/2 tab of Flexeril at night to hopefully help with drowsiness side effect.  [place] driving laws were discussed with the patient, she is not driving. She will follow-up in 3 months.  Thank you for allowing me to participate in her care.  Please do not hesitate to call for any questions or concerns.  The duration of this appointment visit was 15 minutes of face-to-face time with the patient.  Greater than 50% of this time was spent in counseling, explanation of diagnosis, planning of further management, and coordination of care.  Ellouise Newer, M.D.   CC: Dr. Orma Render

## 2013-11-09 NOTE — Patient Instructions (Addendum)
1. Start Zonegran 100mg  as headache preventative: Take 1 capsule at bedtime for 2 weeks, then increase to 2 caps at bedtime for 2 weeks, then increase to 3 caps at bedtime 2. Take Maxalt 10mg  as rescue, as needed at onset of headache. May take second dose after 2 hours. Do not take more than 3 times a week. 3. Take Keppra 500mg  twice a day 4. If Flexeril causes drowsiness, may try 1/2 tablet at bedtime for neck pain 5. Keep headache diary 6. Schedule cervical xray  Seizure Precautions: 1. If medication has been prescribed for you to prevent seizures, take it exactly as directed.  Do not stop taking the medicine without talking to your doctor first, even if you have not had a seizure in a long time.   2. Avoid activities in which a seizure would cause danger to yourself or to others.  Don't operate dangerous machinery, swim alone, or climb in high or dangerous places, such as on ladders, roofs, or girders.  Do not drive unless your doctor says you may.  3. If you have any warning that you may have a seizure, lay down in a safe place where you can't hurt yourself.    4.  No driving for 6 months from last seizure, as per [facility].   Please refer to the following link on the [place] website for more information: http://[email or web address].org/answerplace/Social/driving/drivingu.cfm   5.  Maintain good sleep hygiene.  6.  Notify your neurology if you are planning pregnancy or if you become pregnant.  7.  Contact your doctor if you have any problems that may be related to the medicine you are taking.  8.  Call 911 and bring the patient back to the ED if:        A.  The seizure lasts longer than 5 minutes.       B.  The patient doesn't awaken shortly after the seizure  C.  The patient has new problems such as difficulty seeing, speaking or moving  D.  The patient was injured during the seizure  E.  The patient has a temperature over 102 F (39C)  F.   The patient vomited and now is having trouble breathing

## 2013-11-12 NOTE — Progress Notes (Signed)
Done

## 2013-11-13 NOTE — Addendum Note (Signed)
Addended by: Thurmon Fair on: 11/13/2013 03:10 PM   Modules accepted: Orders

## 2013-12-22 ENCOUNTER — Ambulatory Visit: Payer: Medicaid Other | Admitting: Physical Therapy

## 2014-01-19 ENCOUNTER — Other Ambulatory Visit: Payer: Self-pay | Admitting: Family Medicine

## 2014-01-19 DIAGNOSIS — R569 Unspecified convulsions: Secondary | ICD-10-CM

## 2014-01-19 DIAGNOSIS — R51 Headache: Secondary | ICD-10-CM

## 2014-01-19 DIAGNOSIS — R519 Headache, unspecified: Secondary | ICD-10-CM

## 2014-01-19 MED ORDER — ZONISAMIDE 100 MG PO CAPS: ORAL_CAPSULE | ORAL | Status: DC

## 2014-02-09 ENCOUNTER — Ambulatory Visit: Payer: Medicaid Other | Admitting: Neurology

## 2014-02-09 ENCOUNTER — Telehealth: Payer: Self-pay | Admitting: Neurology

## 2014-02-09 NOTE — Telephone Encounter (Signed)
Noted  

## 2014-02-09 NOTE — Telephone Encounter (Signed)
appt marked as a no show b/c pt called the day of to r/s but a letter was not sent / Sherri S.

## 2014-02-09 NOTE — Telephone Encounter (Signed)
Pt called to r/s her appt. For today. Pt stated that she is not feeling well. Pt is r/s to 03/25/13. C/B [phone]

## 2014-03-25 ENCOUNTER — Encounter: Payer: Self-pay | Admitting: Neurology

## 2014-03-25 ENCOUNTER — Ambulatory Visit (INDEPENDENT_AMBULATORY_CARE_PROVIDER_SITE_OTHER): Payer: Medicaid Other | Admitting: Neurology

## 2014-03-25 VITALS — BP 112/70 | HR 90 | Ht 63.0 in | Wt 181.0 lb

## 2014-03-25 DIAGNOSIS — R569 Unspecified convulsions: Secondary | ICD-10-CM

## 2014-03-25 DIAGNOSIS — R519 Headache, unspecified: Secondary | ICD-10-CM

## 2014-03-25 DIAGNOSIS — R51 Headache: Secondary | ICD-10-CM

## 2014-03-25 DIAGNOSIS — G40209 Localization-related (focal) (partial) symptomatic epilepsy and epileptic syndromes with complex partial seizures, not intractable, without status epilepticus: Secondary | ICD-10-CM

## 2014-03-25 MED ORDER — TIZANIDINE HCL 4 MG PO CAPS
4.0000 mg | ORAL_CAPSULE | Freq: Three times a day (TID) | ORAL | Status: DC | PRN
Start: 2014-03-25 — End: 2014-08-02

## 2014-03-25 MED ORDER — ZONISAMIDE 100 MG PO CAPS: ORAL_CAPSULE | ORAL | Status: DC

## 2014-03-25 NOTE — Patient Instructions (Addendum)
1. Increase Zonisamide 100mg : Take 4 caps at bedtime 2. Do not restart Keppra 3. Take muscle relaxant Tizanidine 4mg  at bedtime for neck pain, may increase up to 3 times daily as needed for neck pain/muscle spasms 4. Continue Maxalt as needed for headache rescue  Seizure Precautions: 1. If medication has been prescribed for you to prevent seizures, take it exactly as directed.  Do not stop taking the medicine without talking to your doctor first, even if you have not had a seizure in a long time.   2. Avoid activities in which a seizure would cause danger to yourself or to others.  Don't operate dangerous machinery, swim alone, or climb in high or dangerous places, such as on ladders, roofs, or girders.  Do not drive unless your doctor says you may.  3. If you have any warning that you may have a seizure, lay down in a safe place where you can't hurt yourself.    4.  No driving for 6 months from last seizure, as per [facility].   Please refer to the following link on the [place] website for more information: http://www.epilepsyfoundation.org/answerplace/Social/driving/drivingu.cfm   5.  Maintain good sleep hygiene.  6.  Contact your doctor if you have any problems that may be related to the medicine you are taking.  7.  Call 911 and bring the patient back to the ED if:        A.  The seizure lasts longer than 5 minutes.       B.  The patient doesn't awaken shortly after the seizure  C.  The patient has new problems such as difficulty seeing, speaking or moving  D.  The patient was injured during the seizure  E.  The patient has a temperature over 102 F (39C)  F.  The patient vomited and now is having trouble breathing

## 2014-03-25 NOTE — Progress Notes (Signed)
NEUROLOGY FOLLOW UP OFFICE NOTE  Suzanne Velazquez [id number]  HISTORY OF PRESENT ILLNESS: I had the pleasure of seeing Suzanne Velazquez in follow-up in the neurology clinic on 03/25/2014.  The patient was last seen 5 months ago for headaches and seizures. On her last visit, she reported continued daily headaches and was started on Zonisamide for headache and seizure prophylaxis. PT was also recommended since she also reported neck pain, however she states this was not on her bus line and could not attend. She feels the Zonegran has helped with the headaches, she continues to have them daily, but the intensity has decreased and she has not needed to go to the ER. The prn Maxalt also helps, it eases the pain and makes her sleepy but does not completely take the pain away. She denies any seizures since restarting Keppra in June 2015. She does feel that headaches would worsen after taking the Keppra dose, and ran out of medication last week without refilling it. She has been started on Trazodone by her psychiatrist for insomnia.   She denies any focal numbness/tingling/weakness, no dizziness, diplopia, She fell down the stairs twice in the past few weeks, denies any seizure-like symptoms or aura, she is unsure if she just lost her footing. No tongue bite/incontinence.   HPI: This is a 44 yo RH woman with a history of hypertension, seizures, and headaches.  1. Seizures: She started having seizures in 2006. Seizures would start would a bad headache that would get intense quickly with sharp pain over the left temporal region, occasional nausea, then she has to lie down and could not function. She reports that she would then have a seizure, where she starts feeling a little shaky like she would faint, sees white spots in her vision, smells an alcohol ("like rubbing alcohol") smell, then has no recollection of events. Family has told her she would become unresponsive, her right arm or leg would extend up,  followed by violent shaking for 30-45 seconds. She feels diffusely sore after, no focal weakness. She hit her head one time, otherwise no other injuries/tongue bite/incontinence. She has not had any shaking episodes since June 2014, however she continues to have smaller episodes where she has the feeling like it's going to happen with the alcohol smell, but does not progress. She would usually sit and calm herself down, lasting 1-2 minutes. In the past she was told by her daughter that she would have staring and unresponsive episodes, however since this daughter moved to [place], she is unaware of any further episodes, her other 2 children have not mentioned anything similar.  Prior AEDs: Topamax, Depakote, Lamictal, ?Tegretol with no effect. Once she started Keppra, seizure frequency became much less.   Epilepsy Risk Factors: Her maternal cousin had seizures since childhood, her son has autism and intractable epilepsy. She collapsed in the 4th grade but has had no further similar symptoms until 2006. She graduated high school in regular classes. There is no history of febrile convulsions, CNS infections, significant traumatic brain injuries, or neurosurgical procedures.   2. Headaches: She recalls having headaches for the past 20 years. She reports "there is not a single day that I am not in pain." She has been to the ER a couple of times a month for a period of 10 years due to intense pain and has had multiple head imaging studies. Headaches are usually a throbbing aggravating 4/10 pain over the left temporal region, waxing and waning in intensity up to  a 10/10 with pain in the base of the neck. She used to take 6-8 tablets of Advil daily, but recently had reduced this to 4 tabs/day in addition to Flexeril. There is some nausea and photophobia with the headaches.   She has not taken Tramadol because this does not help and actually makes her headache worse. She recalls trying amitriptyline, nortriptyline,  ?Effexor for the headaches in the past with minimal effect. Imitrex and Relpax made her headaches worse.  Diagnostic Data:  I personally reviewed head CT done 08/22/13 which was unremarkable. She had a head CT in 2004 with note of fluid collection in the left medial temporal lobe. 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 Imaging studies unavailable for review. Her most recent MRI brain with and without contrast in 2010 reported as normal, on my review, the cystic structure on the left medial temporal lobe is noted with no abnormal enhancement.  Her routine EEG was normal.   PAST MEDICAL HISTORY: Past Medical History  Diagnosis Date  . Hypertension   . Urinary tract infection   . Tumor cells, benign   . Fibromyalgia 2013  . Seizures     MEDICATIONS: Current Outpatient Prescriptions on File Prior to Visit  Medication Sig Dispense Refill  . ALPRAZolam (XANAX) 0.5 MG tablet Take 0.5 mg by mouth 3 (three) times daily as needed for anxiety.    Marland Kitchen aspirin EC 81 MG tablet Take 81 mg by mouth daily.    Marland Kitchen levETIRAcetam (KEPPRA) 500 MG tablet Take 1 tablet (500 mg total) by mouth 2 (two) times daily. (ran out last week) 60 tablet 6  . lisinopril (PRINIVIL,ZESTRIL) 5 MG tablet Take 5 mg by mouth daily.    . rizatriptan (MAXALT-MLT) 10 MG disintegrating tablet Take 1 tablet (10 mg total) by mouth as needed for migraine. May repeat in 2 hours if needed. Do not take more than 3 a week 9 tablet 11  . zonisamide (ZONEGRAN) 100 MG capsule Take 3 capsules at bedtime 90 capsule 3   No current facility-administered medications on file prior to visit.    ALLERGIES: Allergies  Allergen Reactions  . Codeine Itching  . Magnesium-Containing Compounds Swelling    FAMILY HISTORY: Family History  Problem Relation Age of Onset  . Heart disease Father     CAD at age 2  . Heart disease Daughter      MVP    SOCIAL HISTORY: History   Social History  . Marital Status: Legally Separated    Spouse Name: N/A    Number of Children: 4  . Years of Education: N/A   Occupational History  . UNEMPLOYED    Social History Main Topics  . Smoking status: Never Smoker   . Smokeless tobacco: Not on file  . Alcohol Use: No  . Drug Use: No  . Sexual Activity: Not on file   Other Topics Concern  . Not on file   Social History Narrative    REVIEW OF SYSTEMS: Constitutional: No fevers, chills, or sweats, no generalized fatigue, change in appetite Eyes: No visual changes, double vision, eye pain Ear, nose and throat: No hearing loss, ear pain, nasal congestion, sore throat Cardiovascular: No chest pain, palpitations Respiratory:  No shortness of breath at rest or with exertion, wheezes GastrointestinaI: No nausea, vomiting, diarrhea, abdominal pain, fecal incontinence Genitourinary:  No dysuria, urinary retention or frequency Musculoskeletal:  +  neck pain, back pain Integumentary: No rash, pruritus, skin lesions Neurological: as above Psychiatric: + depression, insomnia, anxiety Endocrine: No palpitations, fatigue, diaphoresis, mood swings, change in appetite, change in weight, increased thirst Hematologic/Lymphatic:  No anemia, purpura, petechiae. Allergic/Immunologic: no itchy/runny eyes, nasal congestion, recent allergic reactions, rashes  PHYSICAL EXAM: Filed Vitals:   03/25/14 0944  BP: 112/70  Pulse: 90   General: No acute distress, tired appearing Head:  Normocephalic/atraumatic Neck: supple, no paraspinal tenderness, full range of motion Heart:  Regular rate and rhythm Lungs:  Clear to auscultation bilaterally Back: No paraspinal tenderness Skin/Extremities: No rash, no edema Neurological Exam: alert and oriented to person, place, and time. No aphasia or dysarthria. Fund of knowledge is appropriate.  Recent and remote memory are intact.  Attention and concentration are  normal.    Able to name objects and repeat phrases. Cranial nerves: Pupils equal, round, reactive to light but more sluggish on left (similar to prior).  Fundoscopic exam unremarkable, no papilledema. Extraocular movements intact with no nystagmus. Visual fields full. Facial sensation intact. No facial asymmetry. Tongue, uvula, palate midline.  Motor: Bulk and tone normal, muscle strength 5/5 throughout with no pronator drift.  Sensation to light touch intact.  No extinction to double simultaneous stimulation.  Deep tendon reflexes 2+ except for +1 bilateral ankle jerks.toes downgoing.  Finger to nose testing intact.  Gait narrow-based and steady, able to tandem walk adequately.  Romberg negative.  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 Her brain imaging shows a small cyst in the left medial temporal lobe, routine EEG normal. She denies any convulsions since June 2014, no further simple partial seizures with olfactory aura since restarting Keppra 500mg  BID. She however feels Keppra is causing more headaches and did not refill after running out last week. She has been taking Zonegran 300mg  qhs for headache and seizure prophylaxis, with some improvement in headaches. She will increase dose of Zonegran to 400mg  qhs. 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 She will let us know once she hears from [employer] transportation. She will keep a headache calendar and knows to minimize use of rescue medications to 2-3/week to avoid rebound headaches. She will continue prn Maxalt for rescue. Flexeril did not help with neck pain, she will try a different muscle relaxant Tizanidine, side effects were discussed. [place] driving laws were discussed with the patient, she is not driving.  She will follow-up in 4 months.  Thank you for allowing me to participate in her care.  Please do not hesitate to call for any questions or concerns.  The duration of this appointment visit was 15 minutes of face-to-face time with the patient.  Greater than 50% of this time was spent in counseling, explanation of diagnosis, planning of further management, and coordination of care.   Ellouise Newer, M.D.   CC: Dr. Orma Render

## 2014-03-29 ENCOUNTER — Encounter: Payer: Self-pay | Admitting: Neurology

## 2014-03-29 NOTE — Progress Notes (Signed)
Done

## 2014-07-26 ENCOUNTER — Ambulatory Visit: Payer: Medicaid Other | Admitting: Neurology

## 2014-07-26 ENCOUNTER — Encounter: Payer: Self-pay | Admitting: Neurology

## 2014-08-02 ENCOUNTER — Other Ambulatory Visit: Payer: Self-pay | Admitting: Neurology

## 2014-08-23 ENCOUNTER — Ambulatory Visit (INDEPENDENT_AMBULATORY_CARE_PROVIDER_SITE_OTHER): Payer: Medicaid Other | Admitting: Neurology

## 2014-08-23 ENCOUNTER — Encounter: Payer: Self-pay | Admitting: Neurology

## 2014-08-23 VITALS — BP 130/70 | HR 93 | Ht 63.0 in | Wt 191.2 lb

## 2014-08-23 DIAGNOSIS — M542 Cervicalgia: Secondary | ICD-10-CM | POA: Diagnosis not present

## 2014-08-23 DIAGNOSIS — R569 Unspecified convulsions: Secondary | ICD-10-CM

## 2014-08-23 DIAGNOSIS — R253 Fasciculation: Secondary | ICD-10-CM

## 2014-08-23 DIAGNOSIS — R519 Headache, unspecified: Secondary | ICD-10-CM

## 2014-08-23 DIAGNOSIS — G40209 Localization-related (focal) (partial) symptomatic epilepsy and epileptic syndromes with complex partial seizures, not intractable, without status epilepticus: Secondary | ICD-10-CM | POA: Diagnosis not present

## 2014-08-23 DIAGNOSIS — IMO0001 Reserved for inherently not codable concepts without codable children: Secondary | ICD-10-CM

## 2014-08-23 DIAGNOSIS — R51 Headache: Secondary | ICD-10-CM

## 2014-08-23 MED ORDER — RIZATRIPTAN BENZOATE 10 MG PO TBDP: 10.0000 mg | ORAL_TABLET | ORAL | Status: DC | PRN

## 2014-08-23 MED ORDER — ZONISAMIDE 100 MG PO CAPS: ORAL_CAPSULE | ORAL | Status: DC

## 2014-08-23 NOTE — Patient Instructions (Signed)
1. Continue Zonegran 400mg  at bedtime 2. Continue Maxalt as needed for headaches. Do not take more than 3 a week 3. Schedule 24-hour EEG, write down the times when you have the body jerks during this test 4. Proceed with seeing chiropractor for neck pain. If neck pain continues, call our office and we will set you up with physical therapy 5. As per [place] driving laws, after an episode of loss of consciousness, stop driving until 6 months event-free 6. Follow-up in 3 months, call our office for any changes

## 2014-08-23 NOTE — Progress Notes (Signed)
NEUROLOGY FOLLOW UP OFFICE NOTE  Suzanne Velazquez [id number]  HISTORY OF PRESENT ILLNESS: I had the pleasure of seeing Suzanne Velazquez in follow-up in the neurology clinic on 08/23/2014.  The patient was last seen 5 months ago for headaches and seizures. On her last visit, she reported some improvement of headaches with Zonegran. Dose was increased to 400mg  qhs. She denies any side effects on the medication, but sometimes feels that her pain is sometimes worse. Pain is in the mid-occipital region, starting from the neck. Maxalt ODT does help her. She has not yet been to PT, and asks about seeing a chiropractor. She has a diagnosis of rheumatoid arthritis and is currently on a 30-day course of steroids and Mobic. Being on steroids makes her feel better in general. She does not sleep well due to neck pain, with 5 hours of unrefreshing sleep. She also reports episodes of significant body jerks that would wake her up at night, usually when first falling asleep. Otherwise she denies any typical seizures since starting seizure medication in June 2014. She was initially on Keppra but stopped it after feeling she had more headaches.   She denies any focal numbness/tingling/weakness, no dizziness, diplopia, dizziness, bowel/bladder dysfunction. No recent falls. She is taking care of her 62 year old grandson.  HPI: This is a 61 yo RH woman with a history of hypertension, seizures, and headaches.  1. Seizures: She started having seizures in 2006. Seizures would start would a bad headache that would get intense quickly with sharp pain over the left temporal region, occasional nausea, then she has to lie down and could not function. She reports that she would then have a seizure, where she starts feeling a little shaky like she would faint, sees white spots in her vision, smells an alcohol ("like rubbing alcohol") smell, then has no recollection of events. Family has told her she would become unresponsive, her  right arm or leg would extend up, followed by violent shaking for 30-45 seconds. She feels diffusely sore after, no focal weakness. She hit her head one time, otherwise no other injuries/tongue bite/incontinence. She has not had any shaking episodes since June 2014, however she continues to have smaller episodes where she has the feeling like it's going to happen with the alcohol smell, but does not progress. She would usually sit and calm herself down, lasting 1-2 minutes. In the past she was told by her daughter that she would have staring and unresponsive episodes, however since this daughter moved to [place], she is unaware of any further episodes, her other 2 children have not mentioned anything similar.  Prior AEDs: Topamax, Depakote, Lamictal, ?Tegretol with no effect. Once she started Keppra, seizure frequency became much less.   Epilepsy Risk Factors: Her maternal cousin had seizures since childhood, her son has autism and intractable epilepsy. She collapsed in the 4th grade but has had no further similar symptoms until 2006. She graduated high school in regular classes. There is no history of febrile convulsions, CNS infections, significant traumatic brain injuries, or neurosurgical procedures.   2. Headaches: She recalls having headaches for the past 20 years. She reports "there is not a single day that I am not in pain." She has been to the ER a couple of times a month for a period of 10 years due to intense pain and has had multiple head imaging studies. Headaches are usually a throbbing aggravating 4/10 pain over the left temporal region, waxing and waning in intensity up  to a 10/10 with pain in the base of the neck. She used to take 6-8 tablets of Advil daily, but recently had reduced this to 4 tabs/day in addition to Flexeril. There is some nausea and photophobia with the headaches.   She has not taken Tramadol because this does not help and actually makes her headache worse. She recalls  trying amitriptyline, nortriptyline, ?Effexor for the headaches in the past with minimal effect. Imitrex and Relpax made her headaches worse.  Diagnostic Data:  I personally reviewed head CT done 08/22/13 which was unremarkable. She had a head CT in 2004 with note of fluid collection in the left medial temporal lobe. 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 Imaging studies unavailable for review. Her most recent MRI brain with and without contrast in 2010 reported as normal, on my review, the cystic structure on the left medial temporal lobe is noted with no abnormal enhancement.  Her routine EEG was normal.   PAST MEDICAL HISTORY: Past Medical History  Diagnosis Date  . Hypertension   . Urinary tract infection   . Tumor cells, benign   . Fibromyalgia 2013  . Seizures     MEDICATIONS: Current Outpatient Prescriptions on File Prior to Visit  Medication Sig Dispense Refill  . ALPRAZolam (XANAX) 0.5 MG tablet Take 0.5 mg by mouth 3 (three) times daily as needed for anxiety.    Marland Kitchen aspirin EC 81 MG tablet Take 81 mg by mouth daily.    Marland Kitchen lisinopril (PRINIVIL,ZESTRIL) 5 MG tablet Take 5 mg by mouth daily.    . rizatriptan (MAXALT-MLT) 10 MG disintegrating tablet Take 1 tablet (10 mg total) by mouth as needed for migraine. May repeat in 2 hours if needed. Do not take more than 3 a week 9 tablet 11  . tiZANidine (ZANAFLEX) 4 MG tablet Take 1 capsule (4 mg total) by mouth 3 (three) times daily as needed for muscle spasms. 30 tablet 0  . traZODone (DESYREL) 50 MG tablet Take 50 mg by mouth at bedtime as needed.  1  . zonisamide (ZONEGRAN) 100 MG capsule Take 4 capsules at bedtime 120 capsule 11   No current facility-administered medications on file prior to visit.    ALLERGIES: Allergies  Allergen Reactions  . Codeine Itching  . Magnesium-Containing Compounds Swelling    FAMILY  HISTORY: Family History  Problem Relation Age of Onset  . Heart disease Father     CAD at age 58  . Heart disease Daughter     MVP    SOCIAL HISTORY: History   Social History  . Marital Status: Legally Separated    Spouse Name: N/A  . Number of Children: 4  . Years of Education: N/A   Occupational History  . UNEMPLOYED    Social History Main Topics  . Smoking status: Never Smoker   . Smokeless tobacco: Never Used  . Alcohol Use: No  . Drug Use: No  . Sexual Activity: Not on file   Other Topics Concern  . Not on file   Social History Narrative   Lives with dad and son in a one story home.  Does not work.  Education: associate's degree.    REVIEW OF SYSTEMS: Constitutional: No fevers, chills, or sweats, no generalized fatigue, change in appetite Eyes: No visual changes, double vision, eye pain Ear, nose and throat: No hearing loss, ear pain, nasal congestion,  sore throat Cardiovascular: No chest pain, palpitations Respiratory:  No shortness of breath at rest or with exertion, wheezes GastrointestinaI: No nausea, vomiting, diarrhea, abdominal pain, fecal incontinence Genitourinary:  No dysuria, urinary retention or frequency Musculoskeletal:  +neck pain, back pain Integumentary: No rash, pruritus, skin lesions Neurological: as above Psychiatric: + depression, insomnia, anxiety Endocrine: No palpitations, fatigue, diaphoresis, mood swings, change in appetite, change in weight, increased thirst Hematologic/Lymphatic:  No anemia, purpura, petechiae. Allergic/Immunologic: no itchy/runny eyes, nasal congestion, recent allergic reactions, rashes  PHYSICAL EXAM: Filed Vitals:   08/23/14 1238  BP: 130/70  Pulse: 93   General: No acute distress Head:  Normocephalic/atraumatic Neck: supple, no paraspinal tenderness, full range of motion Heart:  Regular rate and rhythm Lungs:  Clear to auscultation bilaterally Back: No paraspinal tenderness Skin/Extremities: No rash,  no edema Neurological Exam: alert and oriented to person, place, and time. No aphasia or dysarthria. Fund of knowledge is appropriate. Recent and remote memory are intact. Attention and concentration are normal. Able to name objects and repeat phrases. Cranial nerves: Pupils equal, round, reactive to light but more sluggish on left (similar to prior). Fundoscopic exam unremarkable, no papilledema. Extraocular movements intact with no nystagmus. Visual fields full. Facial sensation intact. No facial asymmetry. Tongue, uvula, palate midline. Motor: Bulk and tone normal, muscle strength 5/5 throughout with no pronator drift. Sensation to light touch intact. No extinction to double simultaneous stimulation. Deep tendon reflexes 2+ except for +1 bilateral ankle jerks.toes downgoing. Finger to nose testing intact. Gait narrow-based and steady, able to tandem walk adequately. Romberg negative.  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 Her brain imaging shows a small cyst in the left medial temporal lobe, routine EEG normal. She denies any convulsions since June 2014, no further simple partial seizures with olfactory aura since starting AEDs. She stopped Keppra due to worsening headaches, currently on Zonegran 400mg  qhs for headache and seizure prophylaxis. She reports nocturnal episodes of body jerks, unclear if due to seizure versus physiologic hypnic jerks. A 24-hour EEG will be ordered to classify her symptoms. 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 She would like to see a chiropractor first. If ineffective, she will be referred for physical therapy for neck pain and cervicogenic headaches. Continue current dose Zonegran. She will take prn Maxalt for headaches, and knows to minimize intake to 2-3 a week to  avoid rebound headaches. She is aware of [place] driving laws to stop driving after a seizure until 6 months seizure-free. She will follow-up in 3 months.  Thank you for allowing me to participate in her care.  Please do not hesitate to call for any questions or concerns.  The duration of this appointment visit was 15 minutes of face-to-face time with the patient.  Greater than 50% of this time was spent in counseling, explanation of diagnosis, planning of further management, and coordination of care.   Ellouise Newer, M.D.   CC: Dr. Orma Render

## 2014-10-13 ENCOUNTER — Ambulatory Visit (INDEPENDENT_AMBULATORY_CARE_PROVIDER_SITE_OTHER): Payer: Medicaid Other | Admitting: Neurology

## 2014-10-13 DIAGNOSIS — M542 Cervicalgia: Secondary | ICD-10-CM

## 2014-10-13 DIAGNOSIS — IMO0001 Reserved for inherently not codable concepts without codable children: Secondary | ICD-10-CM

## 2014-10-13 DIAGNOSIS — R569 Unspecified convulsions: Secondary | ICD-10-CM

## 2014-10-13 DIAGNOSIS — R51 Headache: Secondary | ICD-10-CM

## 2014-10-13 DIAGNOSIS — G40209 Localization-related (focal) (partial) symptomatic epilepsy and epileptic syndromes with complex partial seizures, not intractable, without status epilepticus: Secondary | ICD-10-CM | POA: Diagnosis not present

## 2014-10-13 DIAGNOSIS — R519 Headache, unspecified: Secondary | ICD-10-CM

## 2014-10-25 ENCOUNTER — Telehealth: Payer: Self-pay | Admitting: Family Medicine

## 2014-10-25 NOTE — Procedures (Signed)
ELECTROENCEPHALOGRAM REPORT  Dates of Recording: 10/13/2014 to 10/14/2014  Patient's Name: Suzanne Velazquez MRN: [id number] Date of Birth: Aug 04, 1970  Referring Provider: Dr. Ellouise Newer  Procedure: 24-hour ambulatory EEG  History: This is a 44 year old woman with a history of seizures where she smells an alcohol smell followed by unresponsiveness, right arm/leg extension, then convulsion. She now has recurrent nocturnal episodes of body jerks, EEG for classification  Medications: Zonegran, Lisinopril, Tizadine, Trazodone  Technical Summary: This is a 24-hour multichannel digital EEG recording measured by the international 10-20 system with electrodes applied with paste and impedances below 5000 ohms performed as portable with EKG monitoring.  The digital EEG was referentially recorded, reformatted, and digitally filtered in a variety of bipolar and referential montages for optimal display.    DESCRIPTION OF RECORDING: During maximal wakefulness, the background activity consisted of a symmetric 11 Hz posterior dominant rhythm which was reactive to eye opening.  There were no epileptiform discharges or focal slowing seen in wakefulness.  During the recording, the patient progresses through wakefulness, drowsiness, and Stage 2 sleep.  Again, there were no epileptiform discharges seen.  Events: The patient did not complete diary and did not report any symptoms.  There were no electrographic seizures seen.  EKG lead was unremarkable.  IMPRESSION: This 24-hour ambulatory EEG study is normal.    CLINICAL CORRELATION: A normal EEG does not exclude a clinical diagnosis of epilepsy. Typical episodes of body jerks were not reported. If further clinical questions remain, inpatient video EEG monitoring may be helpful.   Ellouise Newer, M.D.

## 2014-10-25 NOTE — Telephone Encounter (Signed)
No Answer, will try again 

## 2014-10-25 NOTE — Telephone Encounter (Signed)
-----   Message from Cameron Sprang, MD sent at 10/25/2014 12:19 PM EDT ----- Pls let her know EEG is normal, no seizure discharges seen. No changes to medications, continue current doses. Thanks

## 2014-10-25 NOTE — Telephone Encounter (Signed)
Patient call back. Notified her of result & advisement.

## 2014-11-23 ENCOUNTER — Ambulatory Visit (INDEPENDENT_AMBULATORY_CARE_PROVIDER_SITE_OTHER): Payer: Medicaid Other | Admitting: Neurology

## 2014-11-23 ENCOUNTER — Encounter: Payer: Self-pay | Admitting: Neurology

## 2014-11-23 VITALS — BP 120/70 | HR 91 | Resp 16 | Ht 63.0 in | Wt 196.0 lb

## 2014-11-23 DIAGNOSIS — M542 Cervicalgia: Secondary | ICD-10-CM | POA: Diagnosis not present

## 2014-11-23 DIAGNOSIS — G40209 Localization-related (focal) (partial) symptomatic epilepsy and epileptic syndromes with complex partial seizures, not intractable, without status epilepticus: Secondary | ICD-10-CM

## 2014-11-23 DIAGNOSIS — R51 Headache: Secondary | ICD-10-CM | POA: Diagnosis not present

## 2014-11-23 DIAGNOSIS — R519 Headache, unspecified: Secondary | ICD-10-CM

## 2014-11-23 MED ORDER — PROPRANOLOL HCL 20 MG PO TABS: ORAL_TABLET | ORAL | Status: DC

## 2014-11-23 NOTE — Progress Notes (Signed)
NEUROLOGY FOLLOW UP OFFICE NOTE  Suzanne Velazquez [id number]  HISTORY OF PRESENT ILLNESS: I had the pleasure of seeing Suzanne Velazquez in follow-up in the neurology clinic on 11/23/2014.  The patient was last seen 3 months ago for headaches and seizures. No typical seizures since starting seizure medication in June 2014. She had reported significant body jerks waking her up at night. A 24-hour EEG was done, which was normal, no epileptiform discharges seen, however typical events were not captured. She continues to have chronic daily headaches that wax and wane in intensity, with significant pressure in the occipital region radiating down her neck. The past 2 days have been extremely bad, but today it is not as bad. She states there is no neck pain but there is discomfort. She has had some nausea. She denies any focal numbness/tingling/weakness, no dizziness, diplopia, dizziness, bowel/bladder dysfunction. No recent falls. She reports sleep is better, usually getting 6 hours of sleep.   HPI: This is a 44 yo RH woman with a history of hypertension, seizures, and headaches.  1. Seizures: She started having seizures in 2006. Seizures would start would a bad headache that would get intense quickly with sharp pain over the left temporal region, occasional nausea, then she has to lie down and could not function. She reports that she would then have a seizure, where she starts feeling a little shaky like she would faint, sees white spots in her vision, smells an alcohol ("like rubbing alcohol") smell, then has no recollection of events. Family has told her she would become unresponsive, her right arm or leg would extend up, followed by violent shaking for 30-45 seconds. She feels diffusely sore after, no focal weakness. She hit her head one time, otherwise no other injuries/tongue bite/incontinence. She has not had any shaking episodes since June 2014, however she continues to have smaller episodes where she  has the feeling like it's going to happen with the alcohol smell, but does not progress. She would usually sit and calm herself down, lasting 1-2 minutes. In the past she was told by her daughter that she would have staring and unresponsive episodes, however since this daughter moved to [place], she is unaware of any further episodes, her other 2 children have not mentioned anything similar.  Prior AEDs: Topamax, Depakote, Lamictal, ?Tegretol with no effect. Once she started Keppra, seizure frequency became much less.   Epilepsy Risk Factors: Her maternal cousin had seizures since childhood, her son has autism and intractable epilepsy. She collapsed in the 4th grade but has had no further similar symptoms until 2006. She graduated high school in regular classes. There is no history of febrile convulsions, CNS infections, significant traumatic brain injuries, or neurosurgical procedures.   2. Headaches: She recalls having headaches for the past 20 years. She reports "there is not a single day that I am not in pain." She has been to the ER a couple of times a month for a period of 10 years due to intense pain and has had multiple head imaging studies. Headaches are usually a throbbing aggravating 4/10 pain over the left temporal region, waxing and waning in intensity up to a 10/10 with pain in the base of the neck. She used to take 6-8 tablets of Advil daily, but recently had reduced this to 4 tabs/day in addition to Flexeril. There is some nausea and photophobia with the headaches.   She has not taken Tramadol because this does not help and actually makes her headache  worse. She recalls trying amitriptyline, nortriptyline, ?Effexor for the headaches in the past with minimal effect. Imitrex and Relpax made her headaches worse.  Diagnostic Data:  I personally reviewed head CT done 08/22/13 which was unremarkable. She had a head CT in 2004 with note of fluid collection in the left medial temporal lobe. 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 Imaging studies unavailable for review. Her most recent MRI brain with and without contrast in 2010 reported as normal, on my review, the cystic structure on the left medial temporal lobe is noted with no abnormal enhancement.  Her routine EEG was normal  PAST MEDICAL HISTORY: Past Medical History  Diagnosis Date  . Hypertension   . Urinary tract infection   . Tumor cells, benign   . Fibromyalgia 2013  . Seizures     MEDICATIONS: Current Outpatient Prescriptions on File Prior to Visit  Medication Sig Dispense Refill  . ALPRAZolam (XANAX) 0.5 MG tablet Take 0.5 mg by mouth 3 (three) times daily as needed for anxiety.    . predniSONE (DELTASONE) 20 MG tablet Take 20 mg by mouth daily.  0  . rizatriptan (MAXALT-MLT) 10 MG disintegrating tablet Take 1 tablet (10 mg total) by mouth as needed for migraine. May repeat in 2 hours if needed. Do not take more than 3 a week 9 tablet 11  . tiZANidine (ZANAFLEX) 4 MG tablet Take 1 capsule (4 mg total) by mouth 3 (three) times daily as needed for muscle spasms. 30 tablet 0  . traZODone (DESYREL) 50 MG tablet Take 50 mg by mouth at bedtime as needed.  1  . zonisamide (ZONEGRAN) 100 MG capsule Take 4 capsules at bedtime 120 capsule 11  . aspirin EC 81 MG tablet Take 81 mg by mouth daily.     No current facility-administered medications on file prior to visit.    ALLERGIES: Allergies  Allergen Reactions  . Codeine Itching  . Magnesium-Containing Compounds Swelling    FAMILY HISTORY: Family History  Problem Relation Age of Onset  . Heart disease Father     CAD at age 29  . Heart disease Daughter     MVP    SOCIAL HISTORY: Social History   Social History  . Marital Status: Legally Separated    Spouse Name: N/A  . Number of Children: 4  . Years of Education: N/A   Occupational History  .  UNEMPLOYED    Social History Main Topics  . Smoking status: Never Smoker   . Smokeless tobacco: Never Used  . Alcohol Use: No  . Drug Use: No  . Sexual Activity: Not on file   Other Topics Concern  . Not on file   Social History Narrative   Lives with dad and son in a one story home.  Does not work.  Education: associate's degree.    REVIEW OF SYSTEMS: Constitutional: No fevers, chills, or sweats, no generalized fatigue, change in appetite Eyes: No visual changes, double vision, eye pain Ear, nose and throat: No hearing loss, ear pain, nasal congestion, sore throat Cardiovascular: No chest pain, palpitations Respiratory:  No shortness of breath at rest or with exertion, wheezes GastrointestinaI: No nausea, vomiting, diarrhea, abdominal pain, fecal incontinence Genitourinary:  No dysuria, urinary retention or frequency Musculoskeletal:  + neck pain, no back pain Integumentary: No rash, pruritus, skin lesions Neurological: as above Psychiatric: No depression, insomnia, anxiety  Endocrine: No palpitations, fatigue, diaphoresis, mood swings, change in appetite, change in weight, increased thirst Hematologic/Lymphatic:  No anemia, purpura, petechiae. Allergic/Immunologic: no itchy/runny eyes, nasal congestion, recent allergic reactions, rashes  PHYSICAL EXAM: Filed Vitals:   11/23/14 0948  BP: 120/70  Pulse: 91  Resp: 16   General: No acute distress, flat affect Head:  Normocephalic/atraumatic, + right occipital tenderness Neck: supple, no paraspinal tenderness, full range of motion Heart:  Regular rate and rhythm Lungs:  Clear to auscultation bilaterally Back: No paraspinal tenderness Skin/Extremities: No rash, no edema Neurological Exam: alert and oriented to person, place, and time. No aphasia or dysarthria. Fund of knowledge is appropriate.  Recent and remote memory are intact.  Attention and concentration are normal.    Able to name objects and repeat phrases. Cranial  nerves: Pupils equal, round, reactive to light.  Fundoscopic exam unremarkable, no papilledema. Extraocular movements intact with no nystagmus. Visual fields full. Facial sensation intact. No facial asymmetry. Tongue, uvula, palate midline.  Motor: Bulk and tone normal, muscle strength 5/5 throughout with no pronator drift.  Sensation to light touch intact.  No extinction to double simultaneous stimulation.  Deep tendon reflexes 2+ throughout, toes downgoing.  Finger to nose testing intact.  Gait narrow-based and steady, able to tandem walk adequately.  Romberg negative.  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 She denies any convulsions since June 2014, no further simple partial seizures with olfactory aura since starting AEDs. She is taking Zonegran 400mg  qhs for headache and seizure prophylaxis. 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 She will be referred to PT for neck pain and myofascial release to help with headaches. She is interested in starting another headache preventative medication, and will start low dose Propranolol 20mg  qhs x 1 week, then increase to 40mg  qhs. Side effects were discussed. Continue prn Tizanidine and Maxalt. She knows to minimize intake to 2-3 a week to avoid rebound headaches. She is aware of [place] driving laws to stop driving after a seizure until 6 months seizure-free. She will follow-up in 3 months.  Thank you for allowing me to participate in her care.  Please do not hesitate to call for any questions or concerns.  The duration of this appointment visit was 24 minutes of face-to-face time with the patient.  Greater than 50% of this time was spent in counseling, explanation of  diagnosis, planning of further management, and coordination of care.   Ellouise Newer, M.D.   CC: Dr. Orma Render

## 2014-11-23 NOTE — Patient Instructions (Signed)
1. Start Propranolol 20mg : take 1 tablet at bedtime for 1 week, then increase to 2 tablets at bedtime. Monitor for dizziness. 2. Refer to PT for neck pain and cervicogenic headaches 3. Continue Zonegran 400mg  at bedtime 4. Continue Tizanidine as needed for neck pain 5. Follow-up in 3 months, call for any changes

## 2014-12-06 ENCOUNTER — Ambulatory Visit: Payer: Medicaid Other | Attending: Neurology | Admitting: Physical Therapy

## 2014-12-15 ENCOUNTER — Telehealth: Payer: Self-pay

## 2014-12-15 NOTE — Telephone Encounter (Signed)
9/19 patient no showed for PT eval, tried to contact 9/28 to reschedule, phone out of order, faxed MD office

## 2015-03-07 ENCOUNTER — Encounter: Payer: Self-pay | Admitting: Neurology

## 2015-03-07 ENCOUNTER — Ambulatory Visit (INDEPENDENT_AMBULATORY_CARE_PROVIDER_SITE_OTHER): Payer: Medicaid Other | Admitting: Neurology

## 2015-03-07 VITALS — BP 114/82 | HR 95 | Ht 63.0 in | Wt 209.0 lb

## 2015-03-07 DIAGNOSIS — R51 Headache: Secondary | ICD-10-CM | POA: Diagnosis not present

## 2015-03-07 DIAGNOSIS — G43719 Chronic migraine without aura, intractable, without status migrainosus: Secondary | ICD-10-CM | POA: Diagnosis not present

## 2015-03-07 DIAGNOSIS — G40209 Localization-related (focal) (partial) symptomatic epilepsy and epileptic syndromes with complex partial seizures, not intractable, without status epilepticus: Secondary | ICD-10-CM

## 2015-03-07 DIAGNOSIS — R519 Headache, unspecified: Secondary | ICD-10-CM

## 2015-03-07 MED ORDER — PROPRANOLOL HCL 20 MG PO TABS: ORAL_TABLET | ORAL | Status: DC

## 2015-03-07 NOTE — Progress Notes (Signed)
NEUROLOGY FOLLOW UP OFFICE NOTE  TYSHEA THELEN [id number]  HISTORY OF PRESENT ILLNESS: I had the pleasure of seeing Leolia Laura in follow-up in the neurology clinic on 03/07/2015.  The patient was last seen 3 months ago for headaches and seizures. She denies any seizures since starting seizure medication in June 2014. She continued to have chronic daily headaches and was started on Propranolol on her last visit. She reports that headaches eased off when she started the medication, however they are again back more severe. The only consistent medication that helps is Maxalt. She denies any side effects on Propranolol. She had been reporting significant pressure in the occipital region radiating down her neck, but had been unable to do PT due to transportation issues. She had reported being seizure-free since June 2014, but 2-3 weeks ago woke up on the floor with a tongue bite. She states this was atypical for her because she had no warning, her roommate heard a thump and found her on the floor. She has been having more stress this holiday season, with difficulty with sleep initiation. She denies any focal numbness/tingling/weakness, no dizziness, diplopia, dizziness, bowel/bladder dysfunction.   HPI: This is a 44 yo RH woman with a history of hypertension, seizures, and headaches.  1. Seizures: She started having seizures in 2006. Seizures would start would a bad headache that would get intense quickly with sharp pain over the left temporal region, occasional nausea, then she has to lie down and could not function. She reports that she would then have a seizure, where she starts feeling a little shaky like she would faint, sees white spots in her vision, smells an alcohol ("like rubbing alcohol") smell, then has no recollection of events. Family has told her she would become unresponsive, her right arm or leg would extend up, followed by violent shaking for 30-45 seconds. She feels diffusely sore  after, no focal weakness. She hit her head one time, otherwise no other injuries/tongue bite/incontinence. She has not had any shaking episodes since June 2014, however she continues to have smaller episodes where she has the feeling like it's going to happen with the alcohol smell, but does not progress. She would usually sit and calm herself down, lasting 1-2 minutes. In the past she was told by her daughter that she would have staring and unresponsive episodes, however since this daughter moved to [place], she is unaware of any further episodes, her other 2 children have not mentioned anything similar.  Prior AEDs: Topamax, Depakote, Lamictal, ?Tegretol with no effect. Once she started Keppra, seizure frequency became much less.   Epilepsy Risk Factors: Her maternal cousin had seizures since childhood, her son has autism and intractable epilepsy. She collapsed in the 4th grade but has had no further similar symptoms until 2006. She graduated high school in regular classes. There is no history of febrile convulsions, CNS infections, significant traumatic brain injuries, or neurosurgical procedures.   2. Headaches: She recalls having headaches for the past 20 years. She reports "there is not a single day that I am not in pain." She has been to the ER a couple of times a month for a period of 10 years due to intense pain and has had multiple head imaging studies. Headaches are usually a throbbing aggravating 4/10 pain over the left temporal region, waxing and waning in intensity up to a 10/10 with pain in the base of the neck. She used to take 6-8 tablets of Advil daily, but recently had  reduced this to 4 tabs/day in addition to Flexeril. There is some nausea and photophobia with the headaches.   She has not taken Tramadol because this does not help and actually makes her headache worse. She recalls trying amitriptyline, nortriptyline, ?Effexor for the headaches in the past with minimal effect. Imitrex and  Relpax made her headaches worse.  Diagnostic Data:  I personally reviewed head CT done 08/22/13 which was unremarkable. She had a head CT in 2004 with note of fluid collection in the left medial temporal lobe. 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 Imaging studies unavailable for review. Her most recent MRI brain with and without contrast in 2010 reported as normal, on my review, the cystic structure on the left medial temporal lobe is noted with no abnormal enhancement.  Her routine EEG was normal 24-hour EEG was normal, no epileptiform discharges seen, however typical events were not captured.   PAST MEDICAL HISTORY: Past Medical History  Diagnosis Date  . Hypertension   . Urinary tract infection   . Tumor cells, benign   . Fibromyalgia 2013  . Seizures ([place])     MEDICATIONS: Current Outpatient Prescriptions on File Prior to Visit  Medication Sig Dispense Refill  . ALPRAZolam (XANAX) 0.5 MG tablet Take 0.5 mg by mouth 3 (three) times daily as needed for anxiety.    Marland Kitchen PROAIR HFA 108 (90 BASE) MCG/ACT inhaler take 2 puffs FOUR TIMES DAILY AS NEEDED  2  . propranolol (INDERAL) 20 MG tablet Take 1 tablet at bedtime for 1 week, then increase to 2 tablets at bedtime (Patient taking differently: Take 20 mg by mouth at bedtime. ) 60 tablet 5  . rizatriptan (MAXALT-MLT) 10 MG disintegrating tablet Take 1 tablet (10 mg total) by mouth as needed for migraine. May repeat in 2 hours if needed. Do not take more than 3 a week 9 tablet 11  . tiZANidine (ZANAFLEX) 4 MG tablet Take 1 capsule (4 mg total) by mouth 3 (three) times daily as needed for muscle spasms. 30 tablet 0  . traZODone (DESYREL) 50 MG tablet Take 50 mg by mouth at bedtime as needed.  1  . zonisamide (ZONEGRAN) 100 MG capsule Take 4 capsules at bedtime 120 capsule 11  . aspirin EC 81 MG tablet Take 81 mg by mouth daily.  Reported on 03/07/2015     No current facility-administered medications on file prior to visit.    ALLERGIES: Allergies  Allergen Reactions  . Codeine Itching  . Magnesium-Containing Compounds Swelling    FAMILY HISTORY: Family History  Problem Relation Age of Onset  . Heart disease Father     CAD at age 65  . Heart disease Daughter     MVP    SOCIAL HISTORY: Social History   Social History  . Marital Status: Legally Separated    Spouse Name: N/A  . Number of Children: 4  . Years of Education: N/A   Occupational History  . UNEMPLOYED    Social History Main Topics  . Smoking status: Never Smoker   . Smokeless tobacco: Never Used  . Alcohol Use: No  . Drug Use: No  . Sexual Activity: Not on file   Other Topics Concern  . Not on file   Social History Narrative   Lives with dad and son in a one story home.  Does not work.  Education: associate's degree.  REVIEW OF SYSTEMS: Constitutional: No fevers, chills, or sweats, no generalized fatigue, change in appetite Eyes: No visual changes, double vision, eye pain Ear, nose and throat: No hearing loss, ear pain, nasal congestion, sore throat Cardiovascular: No chest pain, palpitations Respiratory:  No shortness of breath at rest or with exertion, wheezes GastrointestinaI: No nausea, vomiting, diarrhea, abdominal pain, fecal incontinence Genitourinary:  No dysuria, urinary retention or frequency Musculoskeletal:  + neck pain, no back pain Integumentary: No rash, pruritus, skin lesions Neurological: as above Psychiatric: No depression, insomnia, anxiety Endocrine: No palpitations, fatigue, diaphoresis, mood swings, change in appetite, change in weight, increased thirst Hematologic/Lymphatic:  No anemia, purpura, petechiae. Allergic/Immunologic: no itchy/runny eyes, nasal congestion, recent allergic reactions, rashes  PHYSICAL EXAM: Filed Vitals:   03/07/15 1545  BP: 114/82  Pulse: 95   General: No acute  distress, flat affect Head:  Normocephalic/atraumatic, + right occipital tenderness (similar to prior) Neck: supple, no paraspinal tenderness, full range of motion Heart:  Regular rate and rhythm Lungs:  Clear to auscultation bilaterally Back: No paraspinal tenderness Skin/Extremities: No rash, no edema Neurological Exam: alert and oriented to person, place, and time. No aphasia or dysarthria. Fund of knowledge is appropriate.  Recent and remote memory are intact.  Attention and concentration are normal.    Able to name objects and repeat phrases. Cranial nerves: Pupils equal, round, reactive to light.  Fundoscopic exam unremarkable, no papilledema. Extraocular movements intact with no nystagmus. Visual fields full. Facial sensation intact. No facial asymmetry. Tongue, uvula, palate midline.  Motor: Bulk and tone normal, muscle strength 5/5 throughout with no pronator drift.  Sensation to light touch intact.  No extinction to double simultaneous stimulation.  Deep tendon reflexes 2+ throughout, toes downgoing.  Finger to nose testing intact.  Gait narrow-based and steady, able to tandem walk adequately.  Romberg negative.  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 She had been seizure-free since June 2014 until 2-3 weeks ago when she had an episode of loss of consciousness with no prior warning, which she reports is atypical for her seizures. She denies missing medications, no alcohol, but has been having sleep difficulties and increased stress which will get better at the end of the holidays per patient. She will continue Zonegran 400mg  qhs for now, however if seizures continue to recur, dose will be increased. She is  on low dose Propranolol for chronic migraine, and continues to report daily headaches, dose will be increased to 20mg  BID. She may be a candidate for Botox in the future. Continue prn Tizanidine and Maxalt. She knows to minimize intake to 2-3 a week to avoid rebound headaches. She is aware of [place] driving laws to stop driving after a seizure until 6 months seizure-free, she does not drive. She will follow-up in 2 months.  Thank you for allowing me to participate in her care.  Please do not hesitate to call for any questions or concerns.  The duration of this appointment visit was 25 minutes of face-to-face time with the patient.  Greater than 50% of this time was spent in counseling, explanation of diagnosis, planning of further management, and coordination of care.   Ellouise Newer, M.D.   CC: Dr. Orma Render

## 2015-03-07 NOTE — Patient Instructions (Addendum)
1. Increase Propranolol 20mg : Take 1 tablet twice a day 2. Continue Zonegran 400mg  at night 3. Follow-up in 2 months, call for any problems  Seizure Precautions: 1. If medication has been prescribed for you to prevent seizures, take it exactly as directed.  Do not stop taking the medicine without talking to your doctor first, even if you have not had a seizure in a long time.   2. Avoid activities in which a seizure would cause danger to yourself or to others.  Don't operate dangerous machinery, swim alone, or climb in high or dangerous places, such as on ladders, roofs, or girders.  Do not drive unless your doctor says you may.  3. If you have any warning that you may have a seizure, lay down in a safe place where you can't hurt yourself.    4.  No driving for 6 months from last seizure, as per [facility].   Please refer to the following link on the [facility] website for more information: http://[email or web address]/[email or web address]/[email or web address]   5.  Maintain good sleep hygiene. Avoid alcohol.  6.  Notify your neurology if you are planning pregnancy or if you become pregnant.  7.  Contact your doctor if you have any problems that may be related to the medicine you are taking.  8.  Call 911 and bring the patient back to the ED if:        A.  The seizure lasts longer than 5 minutes.       B.  The patient doesn't awaken shortly after the seizure  C.  The patient has new problems such as difficulty seeing, speaking or moving  D.  The patient was injured during the seizure  E.  The patient has a temperature over 102 F (39C)  F.  The patient vomited and now is having trouble breathing

## 2015-03-08 ENCOUNTER — Encounter: Payer: Self-pay | Admitting: Neurology

## 2015-03-08 DIAGNOSIS — G43719 Chronic migraine without aura, intractable, without status migrainosus: Secondary | ICD-10-CM | POA: Insufficient documentation

## 2015-05-06 ENCOUNTER — Ambulatory Visit: Payer: Medicaid Other | Admitting: Neurology

## 2015-07-02 ENCOUNTER — Other Ambulatory Visit: Payer: Self-pay | Admitting: Neurology

## 2015-07-08 ENCOUNTER — Encounter: Payer: Self-pay | Admitting: Neurology

## 2015-07-08 ENCOUNTER — Ambulatory Visit (INDEPENDENT_AMBULATORY_CARE_PROVIDER_SITE_OTHER): Payer: Medicaid Other | Admitting: Neurology

## 2015-07-08 VITALS — BP 110/88 | HR 70 | Ht 63.0 in | Wt 210.0 lb

## 2015-07-08 DIAGNOSIS — R51 Headache: Secondary | ICD-10-CM

## 2015-07-08 DIAGNOSIS — G40209 Localization-related (focal) (partial) symptomatic epilepsy and epileptic syndromes with complex partial seizures, not intractable, without status epilepticus: Secondary | ICD-10-CM

## 2015-07-08 DIAGNOSIS — R569 Unspecified convulsions: Secondary | ICD-10-CM

## 2015-07-08 DIAGNOSIS — R519 Headache, unspecified: Secondary | ICD-10-CM

## 2015-07-08 MED ORDER — LACOSAMIDE 100 MG PO TABS: ORAL_TABLET | ORAL | Status: DC

## 2015-07-08 MED ORDER — CARISOPRODOL 250 MG PO TABS: ORAL_TABLET | ORAL | Status: DC

## 2015-07-08 MED ORDER — ZONISAMIDE 100 MG PO CAPS: ORAL_CAPSULE | ORAL | Status: DC

## 2015-07-08 NOTE — Progress Notes (Signed)
NEUROLOGY FOLLOW UP OFFICE NOTE  Suzanne Velazquez [id number]  HISTORY OF PRESENT ILLNESS: I had the pleasure of seeing Suzanne Velazquez in follow-up in the neurology clinic on 45/21/2017.  The patient was last seen 4 months ago for headaches and seizures. She had been seizure-free for almost 3 years, until she reported waking up on the floor with tongue bite last December 2016. She reports that since then, she has had another seizure either at the end of February or beginning of March, where she recalls telling her roommate she was not feeling good, then started having a pressure in the right occipital region. She said she was going to take a nap, then roommate reports a seizure in sleep, with her right arm extended up. She bit her tongue, no incontinence. She denies missing any Zonegran doses, she is currently on 400mg  qhs. On her last visit, she was reporting continued chronic daily headaches, and Propranolol dose was increased to 20mg  BID. She reports that since the seizure, headaches have worsened, more localized over the right occipital region, no associated nausea, vomiting, photo/phonophobia. Maxalt is the only thing that can ease them, but sometimes it does not help the pressure that occurs 2-3 times a week. She denies any focal numbness/tingling/weakness, no dizziness, diplopia, dizziness, bowel/bladder dysfunction.   HPI: This is a 45 yo RH woman with a history of hypertension, seizures, and headaches.  1. Seizures: She started having seizures in 2006. Seizures would start would a bad headache that would get intense quickly with sharp pain over the left temporal region, occasional nausea, then she has to lie down and could not function. She reports that she would then have a seizure, where she starts feeling a little shaky like she would faint, sees white spots in her vision, smells an alcohol ("like rubbing alcohol") smell, then has no recollection of events. Family has told her she would  become unresponsive, her right arm or leg would extend up, followed by violent shaking for 30-45 seconds. She feels diffusely sore after, no focal weakness. She hit her head one time, otherwise no other injuries/tongue bite/incontinence. She has not had any shaking episodes since June 2014, however she continues to have smaller episodes where she has the feeling like it's going to happen with the alcohol smell, but does not progress. She would usually sit and calm herself down, lasting 1-2 minutes. In the past she was told by her daughter that she would have staring and unresponsive episodes, however since this daughter moved to [place], she is unaware of any further episodes, her other 2 children have not mentioned anything similar.  Prior AEDs: Topamax, Depakote, Lamictal, ?Tegretol with no effect. Once she started Keppra, seizure frequency became much less.   Epilepsy Risk Factors: Her maternal cousin had seizures since childhood, her son has autism and intractable epilepsy. She collapsed in the 4th grade but has had no further similar symptoms until 2006. She graduated high school in regular classes. There is no history of febrile convulsions, CNS infections, significant traumatic brain injuries, or neurosurgical procedures.   2. Headaches: She recalls having headaches for the past 20 years. She reports "there is not a single day that I am not in pain." She has been to the ER a couple of times a month for a period of 10 years due to intense pain and has had multiple head imaging studies. Headaches are usually a throbbing aggravating 4/10 pain over the left temporal region, waxing and waning in intensity up  to a 10/10 with pain in the base of the neck. She used to take 6-8 tablets of Advil daily, but recently had reduced this to 4 tabs/day in addition to Flexeril. There is some nausea and photophobia with the headaches.   She has not taken Tramadol because this does not help and actually makes her  headache worse. She recalls trying amitriptyline, nortriptyline, ?Effexor for the headaches in the past with minimal effect. Imitrex and Relpax made her headaches worse.  Diagnostic Data:  I personally reviewed head CT done 08/22/13 which was unremarkable. She had a head CT in 2004 with note of fluid collection in the left medial temporal lobe. 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 Imaging studies unavailable for review. Her most recent MRI brain with and without contrast in 2010 reported as normal, on my review, the cystic structure on the left medial temporal lobe is noted with no abnormal enhancement.  Her routine EEG was normal 24-hour EEG was normal, no epileptiform discharges seen, however typical events were not captured.   PAST MEDICAL HISTORY: Past Medical History  Diagnosis Date  . Hypertension   . Urinary tract infection   . Tumor cells, benign   . Fibromyalgia 2013  . Seizures ([place])     MEDICATIONS: Current Outpatient Prescriptions on File Prior to Visit  Medication Sig Dispense Refill  . ALPRAZolam (XANAX) 0.5 MG tablet Take 0.5 mg by mouth 3 (three) times daily as needed for anxiety.    Marland Kitchen aspirin EC 81 MG tablet Take 81 mg by mouth daily. Reported on 03/07/2015    . ENBREL SURECLICK 50 MG/ML injection 50 mg sq once a week  3  . methotrexate (RHEUMATREX) 2.5 MG tablet TAKE 3 TABLET WEEKLY  3  . propranolol (INDERAL) 20 MG tablet Take 1 tablet twice a day 60 tablet 5  . rizatriptan (MAXALT-MLT) 10 MG disintegrating tablet Take 1 tablet (10 mg total) by mouth as needed for migraine. May repeat in 2 hours if needed. Do not take more than 3 a week 9 tablet 11  . zonisamide (ZONEGRAN) 100 MG capsule Take 4 capsules at bedtime 120 capsule 11  . PROAIR HFA 108 (90 BASE) MCG/ACT inhaler Reported on 07/08/2015  2   No current facility-administered medications on file prior  to visit.    ALLERGIES: Allergies  Allergen Reactions  . Codeine Itching  . Magnesium-Containing Compounds Swelling    FAMILY HISTORY: Family History  Problem Relation Age of Onset  . Heart disease Father     CAD at age 7  . Heart disease Daughter     MVP    SOCIAL HISTORY: Social History   Social History  . Marital Status: Legally Separated    Spouse Name: N/A  . Number of Children: 4  . Years of Education: N/A   Occupational History  . UNEMPLOYED    Social History Main Topics  . Smoking status: Never Smoker   . Smokeless tobacco: Never Used  . Alcohol Use: No  . Drug Use: No  . Sexual Activity: Not on file   Other Topics Concern  . Not on file   Social History Narrative   Lives with dad and son in a one story home.  Does not work.  Education: associate's degree.    REVIEW OF SYSTEMS: Constitutional: No fevers, chills, or sweats, no generalized fatigue, change in appetite Eyes: No  visual changes, double vision, eye pain Ear, nose and throat: No hearing loss, ear pain, nasal congestion, sore throat Cardiovascular: No chest pain, palpitations Respiratory:  No shortness of breath at rest or with exertion, wheezes GastrointestinaI: No nausea, vomiting, diarrhea, abdominal pain, fecal incontinence Genitourinary:  No dysuria, urinary retention or frequency Musculoskeletal:  no neck pain, no back pain Integumentary: No rash, pruritus, skin lesions Neurological: as above Psychiatric: No depression, insomnia, anxiety Endocrine: No palpitations, fatigue, diaphoresis, mood swings, change in appetite, change in weight, increased thirst Hematologic/Lymphatic:  No anemia, purpura, petechiae. Allergic/Immunologic: no itchy/runny eyes, nasal congestion, recent allergic reactions, rashes  PHYSICAL EXAM: Filed Vitals:   07/08/15 1107  BP: 110/88  Pulse: 70   General: No acute distress, flat affect Head:  Normocephalic/atraumatic, + right occipital tenderness  (similar to prior) Neck: supple, no paraspinal tenderness, full range of motion Heart:  Regular rate and rhythm Lungs:  Clear to auscultation bilaterally Back: No paraspinal tenderness Skin/Extremities: No rash, no edema Neurological Exam: alert and oriented to person, place, and time. No aphasia or dysarthria. Fund of knowledge is appropriate.  Recent and remote memory are intact.  Attention and concentration are normal.    Able to name objects and repeat phrases. Cranial nerves: Pupils equal, round, reactive to light.  Fundoscopic exam unremarkable, no papilledema. Extraocular movements intact with no nystagmus. Visual fields full. Facial sensation intact. No facial asymmetry. Tongue, uvula, palate midline.  Motor: Bulk and tone normal, muscle strength 5/5 throughout with no pronator drift.  Sensation to light touch intact.  No extinction to double simultaneous stimulation.  Deep tendon reflexes 2+ throughout, toes downgoing.  Finger to nose testing intact.  Gait narrow-based and steady, able to tandem walk adequately.  Romberg negative.  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 She had been seizure-free since June 2014 until December 2016, now reporting a witnessed seizure around 6 weeks ago. No missed medications. She has tried several AEDs in the past, she was given samples for Vimpat and will take 50mg  BID x 1 week, then increase to 100mg  BID. Side effects were discussed. Continue Zonegran 400mg  qhs. Continue Propranolol 20mg  BID for headaches, this may be further uptitrated in the future. She did not notice much effect from Flexeril and Tizanidine, and will try Soma. Side effects were discussed. She does not drive. She will  follow-up in 5 months.  Thank you for allowing me to participate in her care.  Please do not hesitate to call for any questions or concerns.  The duration of this appointment visit was 25 minutes of face-to-face time with the patient.  Greater than 50% of this time was spent in counseling, explanation of diagnosis, planning of further management, and coordination of care.   Ellouise Newer, M.D.   CC: Dr. Orma Render

## 2015-07-08 NOTE — Patient Instructions (Addendum)
1. Start Vimpat 100mg : Take 1/2 tablet twice a day for 1 week, then increase to 1 tablet twice a day and continue. Start with sample pack, once done, your prescription in the pharmacy will be for Vimpat 100mg  1 tablet twice a day 2. Take Soma 250mg : 1 tablet at night as needed for pain in the back of head 3. Continue Zonisamide 100mg : Take 4 capsules at night 4. Follow-up in 5 months, call for any problems  Seizure Precautions: 1. If medication has been prescribed for you to prevent seizures, take it exactly as directed.  Do not stop taking the medicine without talking to your doctor first, even if you have not had a seizure in a long time.   2. Avoid activities in which a seizure would cause danger to yourself or to others.  Don't operate dangerous machinery, swim alone, or climb in high or dangerous places, such as on ladders, roofs, or girders.  Do not drive unless your doctor says you may.  3. If you have any warning that you may have a seizure, lay down in a safe place where you can't hurt yourself.    4.  No driving for 6 months from last seizure, as per [facility]   Please refer to the following link on the [facility] [facility] website for more information: http://www.epilepsyfoundation.org/answerplace/Social/driving/drivingu.cfm   5.  Maintain good sleep hygiene. Avoid alcohol.  6.  Notify your neurology if you are planning pregnancy or if you become pregnant.  7.  Contact your doctor if you have any problems that may be related to the medicine you are taking.  8.  Call 911 and bring the patient back to the ED if:        A.  The seizure lasts longer than 5 minutes.       B.  The patient doesn't awaken shortly after the seizure  C.  The patient has new problems such as difficulty seeing, speaking or moving  D.  The patient was injured during the seizure  E.  The patient has a temperature over 102 F (39C)  F.  The patient vomited and now is having trouble  breathing

## 2015-07-20 ENCOUNTER — Other Ambulatory Visit: Payer: Self-pay | Admitting: Internal Medicine

## 2015-07-20 DIAGNOSIS — Z1231 Encounter for screening mammogram for malignant neoplasm of breast: Secondary | ICD-10-CM

## 2015-08-09 ENCOUNTER — Ambulatory Visit: Payer: Medicaid Other

## 2015-08-22 ENCOUNTER — Inpatient Hospital Stay: Admission: RE | Admit: 2015-08-22 | Payer: Medicaid Other | Source: Ambulatory Visit

## 2015-08-23 ENCOUNTER — Other Ambulatory Visit: Payer: Self-pay | Admitting: Obstetrics & Gynecology

## 2015-08-23 DIAGNOSIS — R928 Other abnormal and inconclusive findings on diagnostic imaging of breast: Secondary | ICD-10-CM

## 2015-08-26 ENCOUNTER — Ambulatory Visit
Admission: RE | Admit: 2015-08-26 | Discharge: 2015-08-26 | Disposition: A | Discharge status: Home / Self Care | Payer: Medicaid Other | Source: Ambulatory Visit | Attending: Obstetrics & Gynecology | Admitting: Obstetrics & Gynecology

## 2015-08-26 DIAGNOSIS — R928 Other abnormal and inconclusive findings on diagnostic imaging of breast: Secondary | ICD-10-CM

## 2015-10-22 ENCOUNTER — Observation Stay (HOSPITAL_COMMUNITY)
Admission: EM | Admit: 2015-10-22 | Discharge: 2015-10-23 | Disposition: A | Discharge status: Home / Self Care | Payer: Medicaid Other | Attending: Family Medicine | Admitting: Family Medicine

## 2015-10-22 ENCOUNTER — Emergency Department (HOSPITAL_COMMUNITY): Payer: Medicaid Other

## 2015-10-22 ENCOUNTER — Encounter (HOSPITAL_COMMUNITY): Payer: Self-pay | Admitting: Emergency Medicine

## 2015-10-22 ENCOUNTER — Observation Stay (HOSPITAL_COMMUNITY): Payer: Medicaid Other

## 2015-10-22 DIAGNOSIS — R0789 Other chest pain: Secondary | ICD-10-CM | POA: Diagnosis not present

## 2015-10-22 DIAGNOSIS — R55 Syncope and collapse: Secondary | ICD-10-CM

## 2015-10-22 DIAGNOSIS — M797 Fibromyalgia: Secondary | ICD-10-CM | POA: Diagnosis not present

## 2015-10-22 DIAGNOSIS — I10 Essential (primary) hypertension: Secondary | ICD-10-CM | POA: Diagnosis not present

## 2015-10-22 DIAGNOSIS — J45909 Unspecified asthma, uncomplicated: Secondary | ICD-10-CM | POA: Diagnosis present

## 2015-10-22 DIAGNOSIS — G43719 Chronic migraine without aura, intractable, without status migrainosus: Secondary | ICD-10-CM | POA: Diagnosis present

## 2015-10-22 DIAGNOSIS — G43009 Migraine without aura, not intractable, without status migrainosus: Secondary | ICD-10-CM | POA: Insufficient documentation

## 2015-10-22 DIAGNOSIS — Z8249 Family history of ischemic heart disease and other diseases of the circulatory system: Secondary | ICD-10-CM | POA: Insufficient documentation

## 2015-10-22 DIAGNOSIS — J449 Chronic obstructive pulmonary disease, unspecified: Secondary | ICD-10-CM | POA: Insufficient documentation

## 2015-10-22 DIAGNOSIS — R17 Unspecified jaundice: Secondary | ICD-10-CM | POA: Diagnosis present

## 2015-10-22 DIAGNOSIS — R1013 Epigastric pain: Secondary | ICD-10-CM

## 2015-10-22 DIAGNOSIS — R51 Headache: Secondary | ICD-10-CM

## 2015-10-22 DIAGNOSIS — R079 Chest pain, unspecified: Secondary | ICD-10-CM | POA: Diagnosis not present

## 2015-10-22 DIAGNOSIS — R519 Headache, unspecified: Secondary | ICD-10-CM | POA: Diagnosis present

## 2015-10-22 DIAGNOSIS — R739 Hyperglycemia, unspecified: Secondary | ICD-10-CM | POA: Diagnosis present

## 2015-10-22 DIAGNOSIS — K760 Fatty (change of) liver, not elsewhere classified: Secondary | ICD-10-CM | POA: Diagnosis present

## 2015-10-22 DIAGNOSIS — E669 Obesity, unspecified: Secondary | ICD-10-CM | POA: Insufficient documentation

## 2015-10-22 DIAGNOSIS — E876 Hypokalemia: Secondary | ICD-10-CM | POA: Diagnosis not present

## 2015-10-22 DIAGNOSIS — M069 Rheumatoid arthritis, unspecified: Secondary | ICD-10-CM | POA: Diagnosis not present

## 2015-10-22 DIAGNOSIS — Z7982 Long term (current) use of aspirin: Secondary | ICD-10-CM | POA: Insufficient documentation

## 2015-10-22 DIAGNOSIS — G40209 Localization-related (focal) (partial) symptomatic epilepsy and epileptic syndromes with complex partial seizures, not intractable, without status epilepticus: Secondary | ICD-10-CM | POA: Diagnosis present

## 2015-10-22 DIAGNOSIS — Z8739 Personal history of other diseases of the musculoskeletal system and connective tissue: Secondary | ICD-10-CM | POA: Diagnosis present

## 2015-10-22 DIAGNOSIS — Z6835 Body mass index (BMI) 35.0-35.9, adult: Secondary | ICD-10-CM | POA: Diagnosis not present

## 2015-10-22 HISTORY — DX: Unspecified osteoarthritis, unspecified site: M19.90

## 2015-10-22 HISTORY — DX: Other generalized epilepsy and epileptic syndromes, not intractable, without status epilepticus: G40.409

## 2015-10-22 HISTORY — DX: Rheumatoid arthritis, unspecified: M06.9

## 2015-10-22 LAB — BASIC METABOLIC PANEL
Anion gap: 7 (ref 5–15)
BUN: 9 mg/dL (ref 6–20)
CO2: 22 mmol/L (ref 22–32)
Calcium: 9.5 mg/dL (ref 8.9–10.3)
Chloride: 108 mmol/L (ref 101–111)
Creatinine, Ser: 0.84 mg/dL (ref 0.44–1.00)
GFR calc Af Amer: >60 mL/min (ref >60)
GFR calc non Af Amer: >60 mL/min (ref >60)
Glucose, Bld: 165 mg/dL — ABNORMAL HIGH (ref 65–99)
Potassium: 3.3 mmol/L — ABNORMAL LOW (ref 3.5–5.1)
Sodium: 137 mmol/L (ref 135–145)

## 2015-10-22 LAB — CBC
HCT: 39.6 % (ref 36.0–46.0)
Hemoglobin: 12.8 g/dL (ref 12.0–15.0)
MCH: 26.2 pg (ref 26.0–34.0)
MCHC: 32.3 g/dL (ref 30.0–36.0)
MCV: 81.1 fL (ref 78.0–100.0)
Platelets: 255 10*3/uL (ref 150–400)
RBC: 4.88 MIL/uL (ref 3.87–5.11)
RDW: 14 % (ref 11.5–15.5)
WBC: 9.3 10*3/uL (ref 4.0–10.5)

## 2015-10-22 LAB — HEPATIC FUNCTION PANEL
ALT: 24 U/L (ref 14–54)
AST: 23 U/L (ref 15–41)
Albumin: 3.8 g/dL (ref 3.5–5.0)
Alkaline Phosphatase: 54 U/L (ref 38–126)
Bilirubin, Direct: 0.2 mg/dL (ref 0.1–0.5)
Indirect Bilirubin: 1.1 mg/dL — ABNORMAL HIGH (ref 0.3–0.9)
Total Bilirubin: 1.3 mg/dL — ABNORMAL HIGH (ref 0.3–1.2)
Total Protein: 7.6 g/dL (ref 6.5–8.1)

## 2015-10-22 LAB — CK: Total CK: 60 U/L (ref 38–234)

## 2015-10-22 LAB — I-STAT TROPONIN, ED: Troponin i, poc: 0 ng/mL (ref 0.00–0.08)

## 2015-10-22 LAB — TROPONIN I
Troponin I: <0.03 ng/mL (ref <0.03)
Troponin I: 0.04 ng/mL (ref <0.03)
Troponin I: <0.03 ng/mL (ref <0.03)

## 2015-10-22 LAB — C-REACTIVE PROTEIN: CRP: 0.6 mg/dL (ref <1.0)

## 2015-10-22 LAB — BRAIN NATRIURETIC PEPTIDE: B Natriuretic Peptide: 21.2 pg/mL (ref 0.0–100.0)

## 2015-10-22 LAB — GLUCOSE, CAPILLARY: Glucose-Capillary: 100 mg/dL — ABNORMAL HIGH (ref 65–99)

## 2015-10-22 LAB — SEDIMENTATION RATE: Sed Rate: 27 mm/hr — ABNORMAL HIGH (ref 0–22)

## 2015-10-22 LAB — D-DIMER, QUANTITATIVE: D-Dimer, Quant: <0.27 ug/mL-FEU (ref 0.00–0.50)

## 2015-10-22 MED ORDER — GI COCKTAIL ~~LOC~~: 30.0000 mL | Freq: Once | ORAL | Status: DC

## 2015-10-22 MED ORDER — NITROGLYCERIN 2 % TD OINT
0.5000 [in_us] | TOPICAL_OINTMENT | Freq: Four times a day (QID) | TRANSDERMAL | Status: DC
  Administered 2015-10-22 – 2015-10-23 (×5): 0.5 [in_us] via TOPICAL
  Filled 2015-10-22 (×2): qty 30

## 2015-10-22 MED ORDER — PROPRANOLOL HCL 20 MG PO TABS
20.0000 mg | ORAL_TABLET | Freq: Two times a day (BID) | ORAL | Status: DC
  Administered 2015-10-22 – 2015-10-23 (×3): 20 mg via ORAL
  Filled 2015-10-22 (×4): qty 1

## 2015-10-22 MED ORDER — SODIUM CHLORIDE 0.9 % IV BOLUS (SEPSIS)
1000.0000 mL | Freq: Once | INTRAVENOUS | Status: AC
  Administered 2015-10-22: 1000 mL via INTRAVENOUS

## 2015-10-22 MED ORDER — ACETAMINOPHEN 325 MG PO TABS
650.0000 mg | ORAL_TABLET | ORAL | Status: DC | PRN
  Administered 2015-10-22 – 2015-10-23 (×3): 650 mg via ORAL
  Filled 2015-10-22 (×3): qty 2

## 2015-10-22 MED ORDER — ALBUTEROL SULFATE (2.5 MG/3ML) 0.083% IN NEBU: 3.0000 mL | INHALATION_SOLUTION | RESPIRATORY_TRACT | Status: DC | PRN

## 2015-10-22 MED ORDER — LACOSAMIDE 50 MG PO TABS
100.0000 mg | ORAL_TABLET | Freq: Two times a day (BID) | ORAL | Status: DC
  Administered 2015-10-22 – 2015-10-23 (×2): 100 mg via ORAL
  Filled 2015-10-22 (×2): qty 2

## 2015-10-22 MED ORDER — ALPRAZOLAM 0.5 MG PO TABS
0.5000 mg | ORAL_TABLET | Freq: Three times a day (TID) | ORAL | Status: DC | PRN
  Filled 2015-10-22: qty 1

## 2015-10-22 MED ORDER — ENOXAPARIN SODIUM 40 MG/0.4ML ~~LOC~~ SOLN
40.0000 mg | SUBCUTANEOUS | Status: DC
  Administered 2015-10-22 – 2015-10-23 (×2): 40 mg via SUBCUTANEOUS
  Filled 2015-10-22 (×2): qty 0.4

## 2015-10-22 MED ORDER — MORPHINE SULFATE (PF) 2 MG/ML IV SOLN: 1.0000 mg | INTRAVENOUS | Status: DC | PRN

## 2015-10-22 MED ORDER — OXYCODONE HCL 5 MG PO TABS
5.0000 mg | ORAL_TABLET | ORAL | Status: DC | PRN
  Administered 2015-10-22 – 2015-10-23 (×4): 5 mg via ORAL
  Filled 2015-10-22 (×4): qty 1

## 2015-10-22 MED ORDER — ONDANSETRON HCL 4 MG/2ML IJ SOLN
4.0000 mg | Freq: Once | INTRAMUSCULAR | Status: AC
  Administered 2015-10-22: 4 mg via INTRAVENOUS
  Filled 2015-10-22: qty 2

## 2015-10-22 MED ORDER — POTASSIUM CHLORIDE CRYS ER 20 MEQ PO TBCR
40.0000 meq | EXTENDED_RELEASE_TABLET | Freq: Once | ORAL | Status: AC
  Administered 2015-10-22: 40 meq via ORAL
  Filled 2015-10-22: qty 2

## 2015-10-22 MED ORDER — MORPHINE SULFATE (PF) 4 MG/ML IV SOLN
4.0000 mg | Freq: Once | INTRAVENOUS | Status: AC
  Administered 2015-10-22: 4 mg via INTRAVENOUS
  Filled 2015-10-22: qty 1

## 2015-10-22 MED ORDER — ASPIRIN EC 81 MG PO TBEC
81.0000 mg | DELAYED_RELEASE_TABLET | Freq: Every day | ORAL | Status: DC
  Administered 2015-10-23: 81 mg via ORAL
  Filled 2015-10-22: qty 1

## 2015-10-22 MED ORDER — ONDANSETRON HCL 4 MG/2ML IJ SOLN
4.0000 mg | Freq: Four times a day (QID) | INTRAMUSCULAR | Status: DC | PRN
  Administered 2015-10-23: 4 mg via INTRAVENOUS
  Filled 2015-10-22: qty 2

## 2015-10-22 MED ORDER — CARISOPRODOL 350 MG PO TABS: 350.0000 mg | ORAL_TABLET | Freq: Every evening | ORAL | Status: DC | PRN

## 2015-10-22 MED ORDER — PANTOPRAZOLE SODIUM 40 MG PO TBEC
40.0000 mg | DELAYED_RELEASE_TABLET | Freq: Every day | ORAL | Status: DC
  Administered 2015-10-22 – 2015-10-23 (×2): 40 mg via ORAL
  Filled 2015-10-22 (×2): qty 1

## 2015-10-22 NOTE — ED Triage Notes (Signed)
Pain to mid upper chest that woke her from sleep. Deep breath and palpation worsens pain.  Vomiting and diarrhea accompanying, reports she passed out in the midst of all this.  Pt has hx asthma/copd.

## 2015-10-22 NOTE — ED Triage Notes (Signed)
Pt arrives by [facility] with c/o CP since 0230 described as pressure sitting on her chest. Pt had syncopal episode after CP, no injury. Pt also had SOB, wheezing and tightness on auscultation. 0.5 atrovent and 10 albuterol. 324 asp, 20g in left AC. CBG 98, BP 124/60, HR 100, 94% RA, 100% on breathing tx.

## 2015-10-22 NOTE — Progress Notes (Signed)
Pt received from ED. VSS. Pt oriented to room and equipment. Telemetry applied, CCMD notified. Pt complaining of 5/10 chest pain, pt describes it as squeezing. MD paged. Given tylenol and await further orders. Call light within reach, will continue to monitor.   Fritz Pickerel, RN

## 2015-10-22 NOTE — ED Provider Notes (Signed)
[facility] DEPT Provider Note   CSN: [id number] Arrival date & time: 10/22/15  [id number]  First Provider Contact:  First MD Initiated Contact with Patient 10/22/15 [phone]      History   Chief Complaint Chief Complaint  Patient presents with  . Chest Pain   HPI   Suzanne Velazquez is an 45 y.o. female with history of HTN, fibromyalgia, seizures, COPD, asthma who presents to the ED for evaluation of chest pain and shortness of breath. She reports at 2:20 AM this morning she had a sudden onset severe, heavy centralized chest pain that woke her from sleep. The pain was so severe she could not get out of bed or move. She reports associated shortness of breath. She endorses some clamminess. She states eventually the pain improved a bit so she tried to walk to the restroom. In the restroom she had many episodes of emesis due to the degree of pain. She then went her roommate's room to get help. Her roommate told her that pt was standing in the doorway and collapsed to the floor. The next thing pt remembers is being in ambulance. She was given duoneb x 2, 324mg  ASA en route. Pain is now improved to 5/10. Breathing reportedly easier. Denies abdominal pain. She still feels weak. Denies nausea. Denies recent URI symptoms. Denies fever, chills, urinary symptoms. She states she has been using her home advair and all other meds as prescribed. She states she has had chest pain before but nothing ever like this. She reports her PCP recently did a chest x-ray and EKG and was concerned about possible diastolic heart failure and pt sates she is scheduled for an echocardiogram. Endorses fam hx of CAD in her father.  Past Medical History:  Diagnosis Date  . Arthritis   . Fibromyalgia 2013  . Hypertension   . Seizures ([place])   . Tumor cells, benign   . Urinary tract infection     Patient Active Problem List   Diagnosis Date Noted  . 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  Localization-related symptomatic epilepsy and epileptic syndromes with complex partial seizures, not intractable, without status epilepticus ([place]) 08/23/2014  . Neck pain 08/23/2014  . Seizures ([place]) 09/08/2013  . Chronic daily headache 09/08/2013  . Headache 08/22/2013  . Cervicalgia 08/22/2013  . Chest pain 10/16/2010  . Palpitations 10/16/2010  . Hypertension 10/16/2010  . Dyspnea 10/16/2010    Past Surgical History:  Procedure Laterality Date  . BACK SURGERY    . SHOULDER SURGERY    . TONSILLECTOMY      OB History    No data available       Home Medications    Prior to Admission medications   Medication Sig Start Date End Date Taking? Authorizing Provider  ALPRAZolam Duanne Moron) 0.5 MG tablet Take 0.5 mg by mouth 3 (three) times daily as needed for anxiety.    Historical Provider, MD  aspirin EC 81 MG tablet Take 81 mg by mouth daily. Reported on 03/07/2015    Historical Provider, MD  carisoprodol (SOMA) 250 MG tablet Take 1 tablet at night as needed for pain in back of head 07/08/15   Cameron Sprang, MD  ENBREL SURECLICK 50 MG/ML injection 50 mg sq once a week 03/01/15   Historical Provider, MD  Lacosamide 100 MG TABS Take 1 tablet twice a day 07/08/15   Cameron Sprang, MD  methotrexate (RHEUMATREX) 2.5 MG tablet TAKE 3 TABLET WEEKLY 02/21/15   Historical Provider,  MD  PROAIR HFA 108 (90 BASE) MCG/ACT inhaler Reported on 07/08/2015 11/10/14   Historical Provider, MD  propranolol (INDERAL) 20 MG tablet Take 1 tablet twice a day 03/07/15   Cameron Sprang, MD  rizatriptan (MAXALT-MLT) 10 MG disintegrating tablet Take 1 tablet (10 mg total) by mouth as needed for migraine. May repeat in 2 hours if needed. Do not take more than 3 a week 08/23/14   Cameron Sprang, MD  zonisamide [facility]) 100 MG capsule Take 4 capsules at bedtime 07/08/15   Cameron Sprang, MD    Family History Family History  Problem Relation Age of Onset  . Heart disease Father     CAD at age 16  . Heart disease  Daughter     MVP    Social History Social History  Substance Use Topics  . Smoking status: Never Smoker  . Smokeless tobacco: Never Used  . Alcohol use No     Allergies   Sulfa antibiotics; Codeine; and Magnesium-containing compounds   Review of Systems Review of Systems 10 Systems reviewed and are negative for acute change except as noted in the HPI.   Physical Exam Updated Vital Signs BP 120/69 (BP Location: Right Arm)   Pulse 105   Temp 98.7 F (37.1 C) (Oral)   Resp 19   Wt 89.8 kg   LMP 09/30/2015   SpO2 96%   BMI 35.07 kg/m   Physical Exam  Constitutional: She is oriented to person, place, and time.  HENT:  Right Ear: External ear normal.  Left Ear: External ear normal.  Nose: Nose normal.  Mouth/Throat: Oropharynx is clear and moist. No oropharyngeal exudate.  Eyes: Conjunctivae are normal.  Neck: Neck supple.  Cardiovascular: Normal rate, regular rhythm, normal heart sounds and intact distal pulses.   Pulmonary/Chest: Effort normal and breath sounds normal. No respiratory distress. She has no wheezes.  Abdominal: Soft. Bowel sounds are normal. She exhibits no distension. There is no tenderness. There is no rebound and no guarding.  Musculoskeletal: She exhibits no edema.  No LE edema or tenderness  Lymphadenopathy:    She has no cervical adenopathy.  Neurological: She is alert and oriented to person, place, and time. No cranial nerve deficit.  Skin: Skin is warm and dry.  Psychiatric: She has a normal mood and affect.  Nursing note and vitals reviewed.    ED Treatments / Results  Labs (all labs ordered are listed, but only abnormal results are displayed) Labs Reviewed  BASIC METABOLIC PANEL - Abnormal; Notable for the following:       Result Value   Potassium 3.3 (*)    Glucose, Bld 165 (*)    All other components within normal limits  CBC  BRAIN NATRIURETIC PEPTIDE  D-DIMER, QUANTITATIVE (NOT AT [facility])  HEPATIC FUNCTION PANEL  I-STAT  TROPOININ, ED    EKG  EKG Interpretation None       Radiology Dg Chest 2 View  Result Date: 10/22/2015 CLINICAL DATA:  Shortness of breath and chest pain since last night. History of asthma. EXAM: CHEST  2 VIEW COMPARISON:  04/03/2011 FINDINGS: The cardiomediastinal silhouette is within normal limits. The patient has taken a shallower inspiration than on the prior study and there is crowding of the lung markings in the bases. Additionally, there are scattered mild opacities in the left lung base which are largely curvilinear. Minimal right basilar opacities are also noted. There is mild peribronchial thickening. No lobar consolidation, overt edema, pleural effusion, or  pneumothorax. No acute osseous abnormality. IMPRESSION: 1. Mild bibasilar opacities most compatible with atelectasis. 2. Mild peribronchial thickening which may reflect reactive airways disease or bronchitis. Electronically Signed   By: Logan Bores M.D.   On: 10/22/2015 08:34    Procedures Procedures (including critical care time)  Medications Ordered in ED Medications  sodium chloride 0.9 % bolus 1,000 mL (not administered)  potassium chloride SA (K-DUR,KLOR-CON) CR tablet 40 mEq (not administered)  morphine 4 MG/ML injection 4 mg (4 mg Intravenous Given 10/22/15 0941)  ondansetron (ZOFRAN) injection 4 mg (4 mg Intravenous Given 10/22/15 0941)     Initial Impression / Assessment and Plan / ED Course  I have reviewed the triage vital signs and the nursing notes.  Pertinent labs & imaging results that were available during my care of the patient were reviewed by me and considered in my medical decision making (see chart for details).  Clinical Course    Initial workup negative. Pt states pain is returning. HEART score 4. Will call for medical obs admission for chest pain / syncope  9:55 AM I spoke to Erin Hearing of the hospitalist service who will see and admit pt to obs. Appreciate assistance.  Final Clinical  Impressions(s) / ED Diagnoses   Final diagnoses:  Chest pain, unspecified chest pain type  Syncope, unspecified syncope type    New Prescriptions New Prescriptions   No medications on file     Anne Ng, PA-C 10/22/15 [id number]    Kannon Baum Y Detrich Rakestraw, PA-C 10/22/15 [place], MD 10/23/15 [phone]

## 2015-10-22 NOTE — Progress Notes (Signed)
CRITICAL VALUE ALERT  Critical value received:  Troponin 0.04  Date of notification:  10/22/15  Time of notification:  [id number]  Critical value read back:yes  Nurse who received alert:  Annamaria Boots  MD notified (1st page):  Dr. Denton Brick Time of first page:  [phone]

## 2015-10-22 NOTE — ED Notes (Signed)
Patient transported to Ultrasound 

## 2015-10-22 NOTE — ED Notes (Signed)
Admitting at bedside 

## 2015-10-22 NOTE — Progress Notes (Signed)
RUQ [place] with normal GB but findings compatible w/ hepatic steatosis. Resume diet then NPO after MN for Lexiscan in am (cancel if TNI becomes positive)  Erin Hearing, ANP

## 2015-10-22 NOTE — Progress Notes (Signed)
Pt reported CP (same location as previously documented pain) after arrival to nursing unit. Started 1/2" nitro paste, added prn Oxycodone for moderate pain and prn IV MSO4 for severe pain. Due to concerns over possible GI etiology reluctant to use NSAIDs  Erin Hearing, ANP

## 2015-10-22 NOTE — H&P (Signed)
History and Physical    Suzanne Velazquez YBW:[id number] DOB: 04-Jun-1970 DOA: 10/22/2015   PCP: Vista Lawman A, DO   Patient coming from/Resides with: Private residence/lives with roommate  Chief Complaint: Severe epigastric chest pain  HPI: Suzanne Velazquez is a 45 y.o. 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 Patient presents with abrupt onset of epigastric chest discomfort. Patient does have a history of similar chest discomfort dating back to 2009 which has previously been explained by other healthcare providers as being related to her fibromyalgia and her musculoskeletal in etiology. Patient states the pain that occurred overnight this time was different. She reports going to sleep is normal and awakening from sleep with a very severe mid chest epigastric discomfort that was so severe she couldn't move. The pain did not radiate. She said she felt a little sweaty. When she was able to get up she began having vomiting and diarrhea and had a sensation she could not catch her breath. With previous chest pain she has had a similar sensation described as couldn't catch her breath and tightness in the throat. The symptoms persisted and the patient became concerned so she walked to her roommate's room to ask her to take her to the hospital. The patient reports that the last thing she remembers. The roommate states the patient passed out. She was not incontinent of bowel or bladder and did not have any typical seizure activity according to the roommate. In further discussion with the patient she has not started any new medications although about 1 month ago her Enbrel and methotrexate were discontinued. She also is reporting dark stools intermittently for the past month.  ED Course:  Vital signs:  98.7-120/69-106-19-RA ?89% Repeat vital signs: 117/58-99-16- RA 100% 2 view chest x-ray: Mild bibasilar opacities most compatible with atelectasis, mild peribronchial thickening which may reflect reactive airway disease or bronchitis Lab data: Sodium 137, potassium 3.3, CO2 22, BUN 9, creatinine 0.84, glucose 165, LFTs are normal except for mild elevation in total bilirubin of 1.3 and mild elevation in indirect bilirubin 1.1, BNP 21.2, poc troponin 0.00, WBCs 9300 differential not obtained, hemoglobin 12.8, platelets 255,000, d-dimer less than 0.27  Review of Systems:  In addition to the HPI above,  No Fever-chills, myalgias or other constitutional symptoms No changes with Vision or hearing, new weakness, tingling, numbness in any extremity No problems swallowing food or Liquids, indigestion/reflux No Cough, palpitations, orthopnea or DOE No Abdominal pain, N/V; no melena or hematochezia No new skin rashes, lesions, masses or bruises, No new joints pains-aches No recent weight gain or loss No polyuria, polydypsia or polyphagia,   Past Medical History:  Diagnosis Date  . Arthritis   . Fibromyalgia 2013  . Hypertension   . Rheumatoid arthritis ([place])   . Seizures ([place])   . Tonic-clonic epileptic seizures ([place])   . Tumor cells, benign   . Urinary tract infection     Past Surgical History:  Procedure Laterality Date  . BACK SURGERY    . SHOULDER SURGERY    . TONSILLECTOMY      Social History   Social History  . Marital status: Legally Separated    Spouse name: N/A  . Number of children: 4  . Years of education: N/A   Occupational History  . UNEMPLOYED Unemployed   Social History Main Topics  . Smoking status: Never Smoker  .  Smokeless tobacco: Never Used  . Alcohol use No  . Drug use: No  . Sexual activity: No   Other Topics Concern  . Not on file   Social History Narrative   Lives with dad and son in a one story home.  Does not work.  Education: associate's  degree.    Mobility: Without assistive devices Work history: Disabled   Allergies  Allergen Reactions  . Sulfa Antibiotics Anaphylaxis  . Codeine Itching  . Magnesium-Containing Compounds Swelling    Family History  Problem Relation Age of Onset  . Heart disease Father     CAD at age 96  . Heart disease Daughter     MVP   Family history reviewed: Father with premature cardiac disease in his 90s but he was also a heavy smoker  Prior to Admission medications   Medication Sig Start Date End Date Taking? Authorizing Provider  ALPRAZolam Duanne Moron) 0.5 MG tablet Take 0.5 mg by mouth 3 (three) times daily as needed for anxiety.    Historical Provider, MD  aspirin EC 81 MG tablet Take 81 mg by mouth daily. Reported on 03/07/2015    Historical Provider, MD  carisoprodol (SOMA) 250 MG tablet Take 1 tablet at night as needed for pain in back of head 07/08/15   Cameron Sprang, MD  Lacosamide 100 MG TABS Take 1 tablet twice a day 07/08/15   Cameron Sprang, MD  [facility] HFA 108 [phone] BASE) MCG/ACT inhaler Reported on 07/08/2015 11/10/14   Historical Provider, MD  propranolol (INDERAL) 20 MG tablet Take 1 tablet twice a day 03/07/15   Cameron Sprang, MD  rizatriptan (MAXALT-MLT) 10 MG disintegrating tablet Take 1 tablet (10 mg total) by mouth as needed for migraine. May repeat in 2 hours if needed. Do not take more than 3 a week 08/23/14   Cameron Sprang, MD  zonisamide [facility]) 100 MG capsule Take 4 capsules at bedtime 07/08/15   Cameron Sprang, MD    Physical Exam: Vitals:   10/22/15 0824 10/22/15 0830 10/22/15 0930 10/22/15 1000  BP: 109/65 120/66 111/62 117/58  Pulse: 107 95 92 101  Resp: 15 20 16 16   Temp:      TempSrc:      SpO2: 99% 94% 98% 98%  Weight:          Constitutional: NAD, calm, comfortable Eyes: PERRL, lids and conjunctivae normal ENMT: Mucous membranes are moist. Posterior pharynx clear of any exudate or lesions.Normal dentition.  Neck: normal, supple, no masses, no  thyromegaly Respiratory: clear to auscultation bilaterally, no wheezing, no crackles. Normal respiratory effort. No accessory muscle use.  Cardiovascular: Regular rate and rhythm, no murmurs / rubs / gallops.Trace nonpitting upper and lower extremity edema. 2+ pedal pulses. No carotid bruits.  Abdomen: Focal epigastric tenderness with palpation without guarding or rebounding, no masses palpated. No hepatosplenomegaly. Bowel sounds positive.  Musculoskeletal: no clubbing / cyanosis. No joint deformity upper and lower extremities. Good ROM, no contractures. Normal muscle tone.  Skin: no rashes, lesions, ulcers. No induration Neurologic: CN 2-12 grossly intact. Sensation intact, DTR normal. Strength 5/5 x all 4 extremities.  Psychiatric: Normal judgment and insight. Alert and oriented x 3. Normal mood.    Labs on Admission: I have personally reviewed following labs and imaging studies  CBC:  Recent Labs Lab 10/22/15 0735  WBC 9.3  HGB 12.8  HCT 39.6  MCV 81.1  PLT 623   Basic Metabolic Panel:  Recent Labs Lab 10/22/15 0735  NA 137  K 3.3*  CL 108  CO2 22  GLUCOSE 165*  BUN 9  CREATININE 0.84  CALCIUM 9.5   GFR: Estimated Creatinine Clearance: 90.9 mL/min (by C-G formula based on SCr of 0.84 mg/dL). Liver Function Tests:  Recent Labs Lab 10/22/15 0735  AST 23  ALT 24  ALKPHOS 54  BILITOT 1.3*  PROT 7.6  ALBUMIN 3.8   No results for input(s): LIPASE, AMYLASE in the last 168 hours. No results for input(s): AMMONIA in the last 168 hours. Coagulation Profile: No results for input(s): INR, PROTIME in the last 168 hours. Cardiac Enzymes: No results for input(s): CKTOTAL, CKMB, CKMBINDEX, TROPONINI in the last 168 hours. BNP (last 3 results) No results for input(s): PROBNP in the last 8760 hours. HbA1C: No results for input(s): HGBA1C in the last 72 hours. CBG: No results for input(s): GLUCAP in the last 168 hours. Lipid Profile: No results for input(s): CHOL,  HDL, LDLCALC, TRIG, CHOLHDL, LDLDIRECT in the last 72 hours. Thyroid Function Tests: No results for input(s): TSH, T4TOTAL, FREET4, T3FREE, THYROIDAB in the last 72 hours. Anemia Panel: No results for input(s): VITAMINB12, FOLATE, FERRITIN, TIBC, IRON, RETICCTPCT in the last 72 hours. Urine analysis:    Component Value Date/Time   COLORURINE YELLOW 12/17/2012 2022   APPEARANCEUR CLOUDY (A) 12/17/2012 2022   LABSPEC 1.027 12/17/2012 2022   PHURINE 5.5 12/17/2012 2022   GLUCOSEU NEGATIVE 12/17/2012 2022   HGBUR NEGATIVE 12/17/2012 2022   BILIRUBINUR NEGATIVE 12/17/2012 2022   KETONESUR NEGATIVE 12/17/2012 2022   PROTEINUR NEGATIVE 12/17/2012 2022   UROBILINOGEN 1.0 12/17/2012 2022   NITRITE NEGATIVE 12/17/2012 2022   LEUKOCYTESUR MODERATE (A) 12/17/2012 2022   Sepsis Labs: @LABRCNTIP (procalcitonin:4,lacticidven:4) )No results found for this or any previous visit (from the past 240 hour(s)).   Radiological Exams on Admission: Dg Chest 2 View  Result Date: 10/22/2015 CLINICAL DATA:  Shortness of breath and chest pain since last night. History of asthma. EXAM: CHEST  2 VIEW COMPARISON:  04/03/2011 FINDINGS: The cardiomediastinal silhouette is within normal limits. The patient has taken a shallower inspiration than on the prior study and there is crowding of the lung markings in the bases. Additionally, there are scattered mild opacities in the left lung base which are largely curvilinear. Minimal right basilar opacities are also noted. There is mild peribronchial thickening. No lobar consolidation, overt edema, pleural effusion, or pneumothorax. No acute osseous abnormality. IMPRESSION: 1. Mild bibasilar opacities most compatible with atelectasis. 2. Mild peribronchial thickening which may reflect reactive airways disease or bronchitis. Electronically Signed   By: Logan Bores M.D.   On: 10/22/2015 08:34    EKG: (Independently reviewed) Sinus tachycardia with ventricular rate 105 bpm, QTC  456 ms, nonspecific downsloping ST segment in leads 3 and aVF, borderline right axis deviation, no ischemic changes  Assessment/Plan Principal Problem:   Chest pain syndrome -Patient presents with significant severe chest pain that is reproducible over the epigastrium with palpation that appears atypical for cardiac pain-parent similar pain since 2009 previously felt to be related to fibromyalgia -Heart score = 3 -Does have mild elevation in total bilirubin so for completeness of exam obtain right upper quadrant ultrasound to rule out biliary etiology -Given report of frequent prednisone pulse dosing for rheumatoid arthritis patient could have gastritis/esophagitis i.e. GI etiology; also reporting dark stools so we'll check fecal occult blood and begin PPI -Cycle cardiac enzymes -Echocardiogram -Have tentatively scheduled Lexiscan stress test in a.m. as long as troponin remains normal  Active  Problems:   ? Syncopal episode -Patient had some sort of fainting/syncopal episode at home that did not appeared to be related to typical seizures for her -Patient was having severe pain and also reported vomiting and diarrhea are to this episode therefore suspect vasovagal etiology -Echocardiogram and telemetry as above per chest pain evaluation    Rheumatoid arthritis  -Patient reports has "severe" rheumatoid arthritis and is in the process of switching to new rheumatologist through [place] and Rheumatrex discontinued about one month ago and patient reports typical therapies and best response has been to prednisone pulse dosing -Given her underlying rheumatoid arthritis patient could have pericarditis or other cardiac manifestation so follow-up on echo and check CRP, ESR and CK -Check ANA    Serum total bilirubin elevated -See above regarding concerns of possible biliary etiology to chest discomfort and planned right upper quadrant ultrasound    Hypertension -Not on  specific antihypertensive therapy although utilizes Inderal for headaches which can concomitantly control blood pressure    Localization-related symptomatic epilepsy and epileptic syndromes with complex partial seizures, not intractable, without status epilepticus -Last saw neurology/Dr. Delice Lesch April of this year  -Last seizure was at the end of February 2017 -Continue continue Vimpat-dosage was slightly up titrated in April 2017 -According to report from her roommate, the patient did not have seizure activity when she "fainted" at home prior to admission    Acute hypokalemia -Oral replacement -Electrolytes in a.m.    Asthma -Currently not exacerbated and continue prn short acting bronchodilator MDI (ProAir)    Chronic daily headache/Intractable chronic common migraine without aura --Continue Zonegran and Inderal -Currently denies active headache      Fibromyalgia -Continue preadmission Xanax and Soma    Acute hyperglycemia -Likely due to acute stressors -Check hemoglobin A1c      DVT prophylaxis: Lovenox Code Status: Full  Family Communication: No family at bedside Disposition Plan: Anticipate discharge back preadmission on arrival was medical stable Consults called: None  Admission status: Up survey patient/telemetry     Nigel Ericsson L. ANP-BC [facility] Pager [phone]   If 7PM-7AM, please contact night-coverage [email or web address] Password [email or web address]  10/22/2015, 10:41 AM

## 2015-10-23 ENCOUNTER — Observation Stay (HOSPITAL_BASED_OUTPATIENT_CLINIC_OR_DEPARTMENT_OTHER): Payer: Medicaid Other

## 2015-10-23 ENCOUNTER — Observation Stay (HOSPITAL_COMMUNITY): Payer: Medicaid Other

## 2015-10-23 DIAGNOSIS — R079 Chest pain, unspecified: Secondary | ICD-10-CM

## 2015-10-23 LAB — NM MYOCAR MULTI W/SPECT W/WALL MOTION / EF
Estimated workload: 1 METS
Exercise duration (min): 0 min
Exercise duration (sec): 0 s
MPHR: 176 {beats}/min
Peak HR: 107 {beats}/min
Percent HR: 60 %
Rest HR: 65 {beats}/min

## 2015-10-23 LAB — GLUCOSE, CAPILLARY
Glucose-Capillary: 88 mg/dL (ref 65–99)
Glucose-Capillary: 113 mg/dL — ABNORMAL HIGH (ref 65–99)
Glucose-Capillary: 104 mg/dL — ABNORMAL HIGH (ref 65–99)

## 2015-10-23 LAB — ECHOCARDIOGRAM COMPLETE: Weight: 3168 oz

## 2015-10-23 MED ORDER — REGADENOSON 0.4 MG/5ML IV SOLN
INTRAVENOUS | Status: AC
  Filled 2015-10-23: qty 5

## 2015-10-23 MED ORDER — TECHNETIUM TC 99M TETROFOSMIN IV KIT
10.0000 | PACK | Freq: Once | INTRAVENOUS | Status: AC | PRN
  Administered 2015-10-23: 10 via INTRAVENOUS

## 2015-10-23 MED ORDER — PERFLUTREN LIPID MICROSPHERE
1.0000 mL | INTRAVENOUS | Status: AC | PRN
  Administered 2015-10-23: 3 mL via INTRAVENOUS
  Filled 2015-10-23: qty 10

## 2015-10-23 MED ORDER — TECHNETIUM TC 99M TETROFOSMIN IV KIT
30.0000 | PACK | Freq: Once | INTRAVENOUS | Status: AC | PRN
  Administered 2015-10-23: 30 via INTRAVENOUS

## 2015-10-23 MED ORDER — REGADENOSON 0.4 MG/5ML IV SOLN
0.4000 mg | Freq: Once | INTRAVENOUS | Status: AC
  Administered 2015-10-23: 0.4 mg via INTRAVENOUS
  Filled 2015-10-23: qty 5

## 2015-10-23 NOTE — Progress Notes (Signed)
  Echocardiogram 2D Echocardiogram has been performed.  Jennette Dubin 10/23/2015, 3:58 PM

## 2015-10-23 NOTE — Discharge Summary (Signed)
Physician Discharge Summary  Suzanne Velazquez [id number] DOB: 1971/03/03 DOA: 10/22/2015  PCP: Vista Lawman A, DO  Admit date: 10/22/2015 Discharge date: 10/23/2015  Time spent: > 35 minutes  Recommendations for Outpatient Follow-up:  1. Please Evaluate for recent chest discomfort most likely GI in nature. 2. Myoview was negative   Discharge Diagnoses:  Principal Problem:   Chest pain syndrome Active Problems:   Hypertension   Chronic daily headache   Localization-related symptomatic epilepsy and epileptic syndromes with complex partial seizures, not intractable, without status epilepticus ([place])   Intractable chronic common migraine without aura   Rheumatoid arthritis (HCC)   Serum total bilirubin elevated   Fibromyalgia   Acute hypokalemia   Acute hyperglycemia   Asthma   Hepatic steatosis   Discharge Condition: Stable  Diet recommendation: *Heart healthy  Filed Weights   10/22/15 0714  Weight: 89.8 kg (198 lb)    History of present illness:  45 y.o. 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 Patient presents with abrupt onset of epigastric chest discomfort.  Hospital Course:   Syncopal episode -Patient reported vomiting and diarrhea I suspect this could've been secondary to decreased intravascular volume. No new reports of near syncope reported. - Epigastric discomfort could be secondary to viral gastroenteritis    Rheumatoid arthritis  -Patient to continue home medication regimen on d/c    Serum total bilirubin elevated -See above regarding concerns of possible biliary etiology to chest discomfort and planned right upper quadrant ultrasound    Hypertension - blood pressure stable    Localization-related symptomatic epilepsy and epileptic syndromes with complex partial  seizures, not intractable, without status epilepticus -Last saw neurology/Dr. Delice Lesch April of this year  -Last seizure was at the end of February 2017 -Continue continue Vimpat-dosage was slightly up titrated in April 2017 -According to report from her roommate, the patient did not have seizure activity when she "fainted" at home prior to admission    Acute hypokalemia -Oral replacement was administered while patient is in house.    Asthma -Currently not exacerbated and continue prn short acting bronchodilator MDI (ProAir)    Chronic daily headache/Intractable chronic common migraine without aura --Continue home medication regimen.      Fibromyalgia -Continue preadmission Xanax and Soma    Acute hyperglycemia -Likely due to acute stressors   Procedures:  Myoview  Consultations:  none  Discharge Exam: Vitals:   10/23/15 0935 10/23/15 1114  BP:  110/62  Pulse: 93 64  Resp:  18  Temp:  98 F (36.7 C)    General: Pt in nad, alert and awake Cardiovascular: rrr, no rubs Respiratory: no increased wob, no wheezes  Discharge Instructions   Discharge Instructions    Call MD for:  severe uncontrolled pain    Complete by:  As directed   Call MD for:  temperature >100.4    Complete by:  As directed   Diet - low sodium heart healthy    Complete by:  As directed   Discharge instructions    Complete by:  As directed   Please follow-up with your primary care physician in the next one or 2 weeks or sooner should any new concerns arise   Increase activity slowly    Complete by:  As directed     Current Discharge Medication List    CONTINUE these medications which have NOT CHANGED  Details  albuterol (PROAIR HFA) 108 (90 Base) MCG/ACT inhaler Inhale 2 puffs into the lungs every 4 (four) hours as needed for wheezing or shortness of breath.    aspirin EC 81 MG tablet Take 81 mg by mouth daily. Reported on 03/07/2015    carisoprodol (SOMA) 250 MG tablet Take 1 tablet at  night as needed for pain in back of head Qty: 30 tablet, Refills: 5   Associated Diagnoses: Chronic daily headache    Fluticasone-Salmeterol (ADVAIR) 250-50 MCG/DOSE AEPB Inhale 2 puffs into the lungs 2 (two) times daily.    ondansetron (ZOFRAN) 4 MG tablet Take 4 mg by mouth 2 (two) times daily as needed for nausea or vomiting.  Refills: 5    pantoprazole (PROTONIX) 20 MG tablet Take 20 mg by mouth daily.    propranolol (INDERAL) 20 MG tablet Take 1 tablet twice a day Qty: 60 tablet, Refills: 5   Associated Diagnoses: Chronic daily headache    rizatriptan (MAXALT-MLT) 10 MG disintegrating tablet Take 1 tablet (10 mg total) by mouth as needed for migraine. May repeat in 2 hours if needed. Do not take more than 3 a week Qty: 9 tablet, Refills: 11   Associated Diagnoses: Chronic daily headache    solifenacin (VESICARE) 5 MG tablet Take 5 mg by mouth at bedtime.    zonisamide (ZONEGRAN) 100 MG capsule Take 4 capsules at bedtime Qty: 120 capsule, Refills: 11   Associated Diagnoses: Seizures ([place]); Chronic daily headache; Localization-related symptomatic epilepsy and epileptic syndromes with complex partial seizures, not intractable, without status epilepticus (HCC)    Lacosamide 100 MG TABS Take 1 tablet twice a day Qty: 60 tablet, Refills: 5   Associated Diagnoses: Localization-related symptomatic epilepsy and epileptic syndromes with complex partial seizures, not intractable, without status epilepticus ([place])      STOP taking these medications     ALPRAZolam (XANAX) 0.5 MG tablet        Allergies  Allergen Reactions  . Magnesium Sulfate Anaphylaxis  . Sulfa Antibiotics Anaphylaxis  . Codeine Itching      The results of significant diagnostics from this hospitalization (including imaging, microbiology, ancillary and laboratory) are listed below for reference.    Significant Diagnostic Studies: Dg Chest 2 View  Result Date: 10/22/2015 CLINICAL DATA:  Shortness of breath  and chest pain since last night. History of asthma. EXAM: CHEST  2 VIEW COMPARISON:  04/03/2011 FINDINGS: The cardiomediastinal silhouette is within normal limits. The patient has taken a shallower inspiration than on the prior study and there is crowding of the lung markings in the bases. Additionally, there are scattered mild opacities in the left lung base which are largely curvilinear. Minimal right basilar opacities are also noted. There is mild peribronchial thickening. No lobar consolidation, overt edema, pleural effusion, or pneumothorax. No acute osseous abnormality. IMPRESSION: 1. Mild bibasilar opacities most compatible with atelectasis. 2. Mild peribronchial thickening which may reflect reactive airways disease or bronchitis. Electronically Signed   By: Logan Bores M.D.   On: 10/22/2015 08:34   Nm Myocar Multi W/spect W/wall Motion / Ef  Result Date: 10/23/2015 CLINICAL DATA:  45 year old female with chest pain, shortness of breath and arm numbness EXAM: MYOCARDIAL IMAGING WITH SPECT (REST AND PHARMACOLOGIC-STRESS) GATED LEFT VENTRICULAR WALL MOTION STUDY LEFT VENTRICULAR EJECTION FRACTION TECHNIQUE: Standard myocardial SPECT imaging was performed after resting intravenous injection of 10 mCi Tc-3m tetrofosmin. Subsequently, intravenous infusion of Lexiscan was performed under the supervision of the Cardiology staff. At peak effect of the drug,  30 mCi Tc-66m tetrofosmin was injected intravenously and standard myocardial SPECT imaging was performed. Quantitative gated imaging was also performed to evaluate left ventricular wall motion, and estimate left ventricular ejection fraction. COMPARISON:  Prior chest x-ray 10/22/2015 FINDINGS: Perfusion: No decreased activity in the left ventricle on stress imaging to suggest reversible ischemia or infarction. Wall Motion: Normal left ventricular wall motion. No left ventricular dilation. Left Ventricular Ejection Fraction: 66 % End diastolic volume 52 ml End  systolic volume 18 ml IMPRESSION: 1. No reversible ischemia or infarction. 2. Normal left ventricular wall motion. 3. Left ventricular ejection fraction 66% 4. Non invasive risk stratification*: Low *2012 Appropriate Use Criteria for Coronary Revascularization Focused Update: J Am Coll Cardiol. [id number]. http://content.airportbarriers.com.aspx?articleid=[email or web address] Electronically Signed   By: Jacqulynn Cadet M.D.   On: 10/23/2015 12:39   US Abdomen Limited Ruq  Result Date: 10/22/2015 CLINICAL DATA:  New Epigastric pain for 10 hours EXAM: US ABDOMEN LIMITED - RIGHT UPPER QUADRANT COMPARISON:  CT 10/12/2013 FINDINGS: Gallbladder: No gallstones or wall thickening visualized. No sonographic Murphy sign noted by sonographer. Common bile duct: Diameter: Normal 4 mm. Liver: Uniformly increased in parenchymal echogenicity. No duct dilatation. IMPRESSION: 1. Normal gallbladder. 2. Increased liver echogenicity commonly represents hepatic steatosis. Electronically Signed   By: Suzy Bouchard M.D.   On: 10/22/2015 12:29    Microbiology: No results found for this or any previous visit (from the past 240 hour(s)).   Labs: Basic Metabolic Panel:  Recent Labs Lab 10/22/15 0735  NA 137  K 3.3*  CL 108  CO2 22  GLUCOSE 165*  BUN 9  CREATININE 0.84  CALCIUM 9.5   Liver Function Tests:  Recent Labs Lab 10/22/15 0735  AST 23  ALT 24  ALKPHOS 54  BILITOT 1.3*  PROT 7.6  ALBUMIN 3.8   No results for input(s): LIPASE, AMYLASE in the last 168 hours. No results for input(s): AMMONIA in the last 168 hours. CBC:  Recent Labs Lab 10/22/15 0735  WBC 9.3  HGB 12.8  HCT 39.6  MCV 81.1  PLT 255   Cardiac Enzymes:  Recent Labs Lab 10/22/15 1010 10/22/15 1626 10/22/15 2212  CKTOTAL 60  --   --   TROPONINI <0.03 0.04* <0.03   BNP: BNP (last 3 results)  Recent Labs  10/22/15 0735  BNP 21.2    ProBNP (last 3 results) No results for input(s): PROBNP in the last 8760  hours.  CBG:  Recent Labs Lab 10/22/15 2310 10/23/15 0607 10/23/15 1112  GLUCAP 100* 88 113*    Signed:  Velvet Bathe MD.  [facility] 10/23/2015, 3:51 PM

## 2015-10-23 NOTE — Progress Notes (Signed)
Pt returned from nuclear medicine. Complaint of nausea - given PRN Zofran. Telemetry applied, CCMD notified. Will continue to monitor.   Fritz Pickerel, RN

## 2015-10-23 NOTE — Progress Notes (Signed)
Discussed with the patient and all questioned fully answered. She will call me if any problems arise.  Camden Mazzaferro S [place], RN  

## 2015-10-23 NOTE — Progress Notes (Addendum)
IMPRESSION: 1. No reversible ischemia or infarction.  2. Normal left ventricular wall motion.  3. Left ventricular ejection fraction 66%  4. Non invasive risk stratification*: Low   Informed patient and paged Dr. Wendee Beavers above [facility] result. Cardiology was not consulted during this admission, and above normal myoview is very reassuring.  Hilbert Corrigan PA Pager: [phone]

## 2015-10-24 LAB — HEMOGLOBIN A1C
Hgb A1c MFr Bld: 5.5 % (ref 4.8–5.6)
Mean Plasma Glucose: 111 mg/dL

## 2015-10-25 LAB — ANTINUCLEAR ANTIBODIES, IFA: ANA Ab, IFA: NEGATIVE

## 2015-11-04 ENCOUNTER — Telehealth: Payer: Self-pay | Admitting: *Deleted

## 2015-11-04 NOTE — Telephone Encounter (Signed)
Prior authorization request received from [facility] for Carisoprodol Manuela Neptune) 250mg  tablet.    PA completed via phone [phone]).  Approval [id number]; approved from 11/04/15 through 10/29/2016.  Notified pharmacy of approval.

## 2015-11-08 ENCOUNTER — Telehealth: Payer: Self-pay | Admitting: Neurology

## 2015-11-08 NOTE — Telephone Encounter (Signed)
[place] called in regards to PT and would like a call back at [phone]/Dawn

## 2015-11-08 NOTE — Telephone Encounter (Signed)
Spoke with pharmacy, see phone note on prior authorization

## 2015-11-08 NOTE — Telephone Encounter (Signed)
Re-submitted Prior Authorization for carisoprodol (SOMA) 250 MG tablet.  Approval [id number]; approved from 11/08/15 through 11/02/16.  Notified pharmacy.   Below approval was completed by insurance company in error, they authorized 350mg  in error.

## 2015-11-09 ENCOUNTER — Encounter (HOSPITAL_COMMUNITY): Payer: Self-pay

## 2015-11-09 ENCOUNTER — Emergency Department (HOSPITAL_COMMUNITY)
Admission: EM | Admit: 2015-11-09 | Discharge: 2015-11-09 | Disposition: A | Discharge status: Home / Self Care | Payer: Medicaid Other | Attending: Emergency Medicine | Admitting: Emergency Medicine

## 2015-11-09 ENCOUNTER — Emergency Department (HOSPITAL_COMMUNITY): Payer: Medicaid Other

## 2015-11-09 DIAGNOSIS — M7918 Myalgia, other site: Secondary | ICD-10-CM

## 2015-11-09 DIAGNOSIS — I10 Essential (primary) hypertension: Secondary | ICD-10-CM | POA: Diagnosis not present

## 2015-11-09 DIAGNOSIS — J45909 Unspecified asthma, uncomplicated: Secondary | ICD-10-CM | POA: Insufficient documentation

## 2015-11-09 DIAGNOSIS — Z7982 Long term (current) use of aspirin: Secondary | ICD-10-CM | POA: Diagnosis not present

## 2015-11-09 DIAGNOSIS — M79662 Pain in left lower leg: Secondary | ICD-10-CM | POA: Insufficient documentation

## 2015-11-09 HISTORY — DX: Unspecified osteoarthritis, unspecified site: M19.90
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
## 2015-11-09 NOTE — Discharge Instructions (Signed)
Please read attached information. If you experience any new or worsening signs or symptoms please return to the emergency room for evaluation. Please follow-up with your primary care provider or specialist as discussed. Please use medication prescribed only as directed and discontinue taking if you have any concerning signs or symptoms.   °

## 2015-11-09 NOTE — Progress Notes (Signed)
Orthopedic Tech Progress Note Patient Details:  Suzanne Velazquez 1971-02-04 [id number] [employer]utch training  Patient ID: Suzanne Velazquez, female   DOB: 1970/07/07, 45 y.o.   MRN: [id number]   Suzanne Velazquez 11/09/2015, 3:23 PM

## 2015-11-09 NOTE — ED Notes (Signed)
Patient transported to X-ray 

## 2015-11-09 NOTE — ED Notes (Signed)
Ortho tech at bedside 
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
## 2015-11-12 ENCOUNTER — Emergency Department (HOSPITAL_COMMUNITY)
Admission: EM | Admit: 2015-11-12 | Discharge: 2015-11-12 | Disposition: A | Discharge status: Home / Self Care | Payer: Medicaid Other | Attending: Emergency Medicine | Admitting: Emergency Medicine

## 2015-11-12 ENCOUNTER — Emergency Department (HOSPITAL_COMMUNITY): Payer: Medicaid Other

## 2015-11-12 ENCOUNTER — Encounter (HOSPITAL_COMMUNITY): Payer: Self-pay | Admitting: *Deleted

## 2015-11-12 DIAGNOSIS — M5442 Lumbago with sciatica, left side: Secondary | ICD-10-CM | POA: Insufficient documentation

## 2015-11-12 DIAGNOSIS — Z7982 Long term (current) use of aspirin: Secondary | ICD-10-CM | POA: Insufficient documentation

## 2015-11-12 DIAGNOSIS — Y92410 Unspecified street and highway as the place of occurrence of the external cause: Secondary | ICD-10-CM | POA: Insufficient documentation

## 2015-11-12 DIAGNOSIS — Y999 Unspecified external cause status: Secondary | ICD-10-CM | POA: Insufficient documentation

## 2015-11-12 DIAGNOSIS — I10 Essential (primary) hypertension: Secondary | ICD-10-CM | POA: Insufficient documentation

## 2015-11-12 DIAGNOSIS — Z79899 Other long term (current) drug therapy: Secondary | ICD-10-CM | POA: Diagnosis not present

## 2015-11-12 DIAGNOSIS — S9032XA Contusion of left foot, initial encounter: Secondary | ICD-10-CM | POA: Diagnosis not present

## 2015-11-12 DIAGNOSIS — Y939 Activity, unspecified: Secondary | ICD-10-CM | POA: Insufficient documentation

## 2015-11-12 DIAGNOSIS — S161XXA Strain of muscle, fascia and tendon at neck level, initial encounter: Secondary | ICD-10-CM

## 2015-11-12 DIAGNOSIS — S199XXA Unspecified injury of neck, initial encounter: Secondary | ICD-10-CM | POA: Diagnosis present

## 2015-11-12 DIAGNOSIS — J45909 Unspecified asthma, uncomplicated: Secondary | ICD-10-CM | POA: Diagnosis not present

## 2015-11-12 DIAGNOSIS — M5432 Sciatica, left side: Secondary | ICD-10-CM

## 2015-11-12 MED ORDER — CYCLOBENZAPRINE HCL 10 MG PO TABS: 10.0000 mg | ORAL_TABLET | Freq: Two times a day (BID) | ORAL | 0 refills | Status: DC | PRN

## 2015-11-12 MED ORDER — OXYCODONE-ACETAMINOPHEN 5-325 MG PO TABS: 1.0000 | ORAL_TABLET | ORAL | 0 refills | Status: DC | PRN

## 2015-11-12 MED ORDER — CYCLOBENZAPRINE HCL 10 MG PO TABS
10.0000 mg | ORAL_TABLET | Freq: Once | ORAL | Status: AC
  Administered 2015-11-12: 10 mg via ORAL
  Filled 2015-11-12: qty 1

## 2015-11-12 MED ORDER — IBUPROFEN 800 MG PO TABS: 800.0000 mg | ORAL_TABLET | Freq: Three times a day (TID) | ORAL | 0 refills | Status: DC

## 2015-11-12 MED ORDER — DEXAMETHASONE SODIUM PHOSPHATE 10 MG/ML IJ SOLN
10.0000 mg | Freq: Once | INTRAMUSCULAR | Status: DC
  Filled 2015-11-12: qty 1

## 2015-11-12 MED ORDER — KETOROLAC TROMETHAMINE 60 MG/2ML IM SOLN
60.0000 mg | Freq: Once | INTRAMUSCULAR | Status: DC
  Filled 2015-11-12: qty 2

## 2015-11-12 MED ORDER — HYDROCODONE-ACETAMINOPHEN 5-325 MG PO TABS
2.0000 | ORAL_TABLET | Freq: Once | ORAL | Status: AC
  Administered 2015-11-12: 2 via ORAL
  Filled 2015-11-12: qty 2
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
## 2015-11-12 NOTE — ED Notes (Signed)
PT STS SHE CAN NOT HAVE INJECTIONS DUE TO THEM TRIGGERING HER SEIZURES. PA LEISA MADE AWARE.

## 2015-11-12 NOTE — ED Notes (Signed)
PT DISCHARGED. INSTRUCTIONS AND PRESCRIPTIONS GIVEN. AAOX4. PT IN NO APPARENT DISTRESS. THE OPPORTUNITY TO ASK QUESTIONS WAS PROVIDED. 
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
## 2015-11-14 ENCOUNTER — Other Ambulatory Visit: Payer: Self-pay | Admitting: Neurology

## 2015-11-14 DIAGNOSIS — G40209 Localization-related (focal) (partial) symptomatic epilepsy and epileptic syndromes with complex partial seizures, not intractable, without status epilepticus: Secondary | ICD-10-CM

## 2015-11-28 ENCOUNTER — Other Ambulatory Visit (HOSPITAL_COMMUNITY): Payer: Self-pay | Admitting: Orthopedic Surgery

## 2015-11-28 ENCOUNTER — Ambulatory Visit (HOSPITAL_COMMUNITY): Payer: Medicaid Other

## 2015-11-28 DIAGNOSIS — M7989 Other specified soft tissue disorders: Secondary | ICD-10-CM

## 2015-11-28 DIAGNOSIS — M79605 Pain in left leg: Secondary | ICD-10-CM

## 2015-11-30 ENCOUNTER — Ambulatory Visit (HOSPITAL_COMMUNITY): Admission: RE | Admit: 2015-11-30 | Payer: Medicaid Other | Source: Ambulatory Visit

## 2015-12-02 ENCOUNTER — Emergency Department (HOSPITAL_COMMUNITY): Payer: Medicaid Other

## 2015-12-02 ENCOUNTER — Other Ambulatory Visit: Payer: Self-pay

## 2015-12-02 ENCOUNTER — Ambulatory Visit (HOSPITAL_COMMUNITY)
Admission: RE | Admit: 2015-12-02 | Discharge: 2015-12-02 | Disposition: A | Discharge status: Home / Self Care | Payer: Medicaid Other | Source: Ambulatory Visit | Attending: Orthopedic Surgery | Admitting: Orthopedic Surgery

## 2015-12-02 ENCOUNTER — Encounter (HOSPITAL_COMMUNITY): Payer: Self-pay | Admitting: Emergency Medicine

## 2015-12-02 ENCOUNTER — Emergency Department (HOSPITAL_COMMUNITY)
Admission: EM | Admit: 2015-12-02 | Discharge: 2015-12-03 | Disposition: A | Discharge status: Home / Self Care | Payer: Medicaid Other | Attending: Emergency Medicine | Admitting: Emergency Medicine

## 2015-12-02 DIAGNOSIS — Z7982 Long term (current) use of aspirin: Secondary | ICD-10-CM | POA: Insufficient documentation

## 2015-12-02 DIAGNOSIS — R0600 Dyspnea, unspecified: Secondary | ICD-10-CM | POA: Insufficient documentation

## 2015-12-02 DIAGNOSIS — M79662 Pain in left lower leg: Secondary | ICD-10-CM | POA: Diagnosis not present

## 2015-12-02 DIAGNOSIS — Z79899 Other long term (current) drug therapy: Secondary | ICD-10-CM | POA: Insufficient documentation

## 2015-12-02 DIAGNOSIS — M7989 Other specified soft tissue disorders: Secondary | ICD-10-CM | POA: Diagnosis not present

## 2015-12-02 DIAGNOSIS — M79605 Pain in left leg: Secondary | ICD-10-CM

## 2015-12-02 DIAGNOSIS — I509 Heart failure, unspecified: Secondary | ICD-10-CM | POA: Insufficient documentation

## 2015-12-02 DIAGNOSIS — I110 Hypertensive heart disease with heart failure: Secondary | ICD-10-CM | POA: Insufficient documentation

## 2015-12-02 DIAGNOSIS — J45909 Unspecified asthma, uncomplicated: Secondary | ICD-10-CM | POA: Diagnosis not present

## 2015-12-02 DIAGNOSIS — R079 Chest pain, unspecified: Secondary | ICD-10-CM | POA: Diagnosis present

## 2015-12-02 DIAGNOSIS — R938 Abnormal findings on diagnostic imaging of other specified body structures: Secondary | ICD-10-CM | POA: Diagnosis not present

## 2015-12-02 DIAGNOSIS — Z7901 Long term (current) use of anticoagulants: Secondary | ICD-10-CM | POA: Insufficient documentation

## 2015-12-02 HISTORY — DX: Heart failure, unspecified: I50.9

## 2015-12-02 HISTORY — DX: Unspecified asthma, uncomplicated: J45.909

## 2015-12-02 LAB — I-STAT TROPONIN, ED: Troponin i, poc: 0 ng/mL (ref 0.00–0.08)

## 2015-12-02 MED ORDER — IPRATROPIUM-ALBUTEROL 0.5-2.5 (3) MG/3ML IN SOLN
3.0000 mL | Freq: Once | RESPIRATORY_TRACT | Status: AC
  Administered 2015-12-03: 3 mL via RESPIRATORY_TRACT
  Filled 2015-12-02: qty 3

## 2015-12-02 NOTE — Progress Notes (Signed)
*  Preliminary Results* Left lower extremity venous duplex completed. Left lower extremity is positive for acute deep vein thrombosis involving the left peroneal veins. There is no evidence of left Baker's cyst.  Preliminary results discussed with Dr. French Ana and Corinna, [place].  12/02/2015 11:49 AM  Maudry Mayhew, BS, RVT, RDCS, RDMS

## 2015-12-02 NOTE — ED Triage Notes (Signed)
Pt was seen her earlier today. Pt dx with DVT and placed on Xarelto. Pt woke up feeling "not right". Pt woke up vomiting and feeling nausea, with right sided chest pain. [place]
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
## 2015-12-02 NOTE — ED Notes (Signed)
EDP at bedside  

## 2015-12-03 ENCOUNTER — Encounter (HOSPITAL_COMMUNITY): Payer: Self-pay | Admitting: Radiology

## 2015-12-03 ENCOUNTER — Emergency Department (HOSPITAL_COMMUNITY): Payer: Medicaid Other

## 2015-12-03 ENCOUNTER — Other Ambulatory Visit: Payer: Self-pay

## 2015-12-03 LAB — HEPARIN LEVEL (UNFRACTIONATED): Heparin Unfractionated: 2.1 IU/mL — ABNORMAL HIGH (ref 0.30–0.70)

## 2015-12-03 LAB — I-STAT TROPONIN, ED: Troponin i, poc: 0 ng/mL (ref 0.00–0.08)

## 2015-12-03 LAB — BASIC METABOLIC PANEL
Anion gap: 7 (ref 5–15)
BUN: 9 mg/dL (ref 6–20)
CO2: 22 mmol/L (ref 22–32)
Calcium: 9.5 mg/dL (ref 8.9–10.3)
Chloride: 107 mmol/L (ref 101–111)
Creatinine, Ser: 0.86 mg/dL (ref 0.44–1.00)
GFR calc Af Amer: >60 mL/min (ref >60)
GFR calc non Af Amer: >60 mL/min (ref >60)
Glucose, Bld: 128 mg/dL — ABNORMAL HIGH (ref 65–99)
Potassium: 3.5 mmol/L (ref 3.5–5.1)
Sodium: 136 mmol/L (ref 135–145)

## 2015-12-03 LAB — CBC
HCT: 38.8 % (ref 36.0–46.0)
Hemoglobin: 12.3 g/dL (ref 12.0–15.0)
MCH: 26.1 pg (ref 26.0–34.0)
MCHC: 31.7 g/dL (ref 30.0–36.0)
MCV: 82.2 fL (ref 78.0–100.0)
Platelets: 343 10*3/uL (ref 150–400)
RBC: 4.72 MIL/uL (ref 3.87–5.11)
RDW: 13.3 % (ref 11.5–15.5)
WBC: 10.7 10*3/uL — ABNORMAL HIGH (ref 4.0–10.5)

## 2015-12-03 LAB — TROPONIN I: Troponin I: <0.03 ng/mL (ref <0.03)

## 2015-12-03 LAB — I-STAT BETA HCG BLOOD, ED (MC, WL, AP ONLY): I-stat hCG, quantitative: <5 m[IU]/mL (ref <5)

## 2015-12-03 LAB — PROTIME-INR
INR: 1.81
Prothrombin Time: 21.2 seconds — ABNORMAL HIGH (ref 11.4–15.2)

## 2015-12-03 LAB — BRAIN NATRIURETIC PEPTIDE: B Natriuretic Peptide: 12.4 pg/mL (ref 0.0–100.0)

## 2015-12-03 LAB — APTT: aPTT: 40 seconds — ABNORMAL HIGH (ref 24–36)

## 2015-12-03 MED ORDER — ONDANSETRON 4 MG PO TBDP: 4.0000 mg | ORAL_TABLET | Freq: Three times a day (TID) | ORAL | 0 refills | Status: DC | PRN

## 2015-12-03 MED ORDER — RIVAROXABAN 15 MG PO TABS: 15.0000 mg | ORAL_TABLET | Freq: Once | ORAL | Status: DC

## 2015-12-03 MED ORDER — ONDANSETRON 4 MG PO TBDP
8.0000 mg | ORAL_TABLET | Freq: Once | ORAL | Status: AC
  Administered 2015-12-03: 8 mg via ORAL
  Filled 2015-12-03: qty 2

## 2015-12-03 MED ORDER — SODIUM CHLORIDE 0.9 % IV BOLUS (SEPSIS)
1000.0000 mL | Freq: Once | INTRAVENOUS | Status: AC
  Administered 2015-12-03: 1000 mL via INTRAVENOUS

## 2015-12-03 MED ORDER — ONDANSETRON HCL 4 MG/2ML IJ SOLN
4.0000 mg | Freq: Once | INTRAMUSCULAR | Status: AC
  Administered 2015-12-03: 4 mg via INTRAVENOUS
  Filled 2015-12-03: qty 2

## 2015-12-03 MED ORDER — IOPAMIDOL (ISOVUE-370) INJECTION 76%
INTRAVENOUS | Status: AC
  Administered 2015-12-03: 100 mL
  Filled 2015-12-03: qty 100

## 2015-12-03 NOTE — ED Notes (Signed)
Pt c/o nausea.  

## 2015-12-03 NOTE — ED Notes (Signed)
Pt understood dc material. NAD noted. Scripts given at dc 

## 2015-12-03 NOTE — Discharge Instructions (Signed)
We saw you in the ER for the chest pain/shortness of breath. All of our cardiac workup is normal, including labs, EKG and chest X-RAY are normal. We even screened you for a blood clot -and it was negative as well. We are not sure what is causing your discomfort, but we feel comfortable sending you home at this time. The workup in the ER is not complete, and you should follow up with your primary care doctor for further evaluation.  Please return to the ER if you have worsening chest pain, shortness of breath, pain radiating to your jaw, shoulder, or back, sweats or fainting.

## 2015-12-07 ENCOUNTER — Ambulatory Visit (INDEPENDENT_AMBULATORY_CARE_PROVIDER_SITE_OTHER): Payer: Medicaid Other | Admitting: Cardiovascular Disease

## 2015-12-07 ENCOUNTER — Other Ambulatory Visit: Payer: Self-pay | Admitting: Cardiovascular Disease

## 2015-12-07 ENCOUNTER — Encounter: Payer: Self-pay | Admitting: Cardiovascular Disease

## 2015-12-07 VITALS — BP 100/66 | HR 96 | Ht 63.0 in | Wt 175.6 lb

## 2015-12-07 DIAGNOSIS — I825Z2 Chronic embolism and thrombosis of unspecified deep veins of left distal lower extremity: Secondary | ICD-10-CM | POA: Diagnosis not present

## 2015-12-07 DIAGNOSIS — R0989 Other specified symptoms and signs involving the circulatory and respiratory systems: Secondary | ICD-10-CM

## 2015-12-07 DIAGNOSIS — I825Z9 Chronic embolism and thrombosis of unspecified deep veins of unspecified distal lower extremity: Secondary | ICD-10-CM | POA: Insufficient documentation

## 2015-12-07 NOTE — Patient Instructions (Signed)
Medication Instructions:  Your physician has recommended you make the following change in your medication:  1- STOP XARELTO. 2- START Eliquis 10 mg (2 tablets) by mouth twice a day for 7 days, then change to Eliquis 5 mg (1 tablet) by mouth twice a day.   Testing/Procedures: Your physician has requested that you have a lower extremity arterial doppler- During this test, ultrasound is used to evaluate arterial blood flow in the legs. Allow approximately one hour for this exam.    Dr Gwenlyn Found has ordered you to have a KNEE HIGH COMPRESSION STOCKING AT 20-30 MMHG FOR YOUR LEFT LEG.  Follow-Up: Your physician recommends that you schedule a follow-up appointment in: [place].   If you need a refill on your cardiac medications before your next appointment, please call your pharmacy.
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
## 2015-12-09 ENCOUNTER — Ambulatory Visit: Payer: Medicaid Other | Admitting: Neurology

## 2015-12-14 ENCOUNTER — Telehealth: Payer: Self-pay | Admitting: Cardiovascular Disease

## 2015-12-14 MED ORDER — APIXABAN 5 MG PO TABS: 5.0000 mg | ORAL_TABLET | Freq: Two times a day (BID) | ORAL | 6 refills | Status: DC

## 2015-12-14 NOTE — Telephone Encounter (Signed)
New Message  Pt c/o medication issue:  1. Name of Medication: eliquis  2. How are you currently taking this medication (dosage and times per day)? Not taking it  3. Are you having a reaction (difficulty breathing--STAT)? no  4. What is your medication issue? Pt voiced she was receiving samples but she was suppose to have a prescription and wants to speak with a nurse.  Please f/u with pt.

## 2015-12-14 NOTE — Telephone Encounter (Signed)
Spoke to patient.  Patient states she only has enough samples for tonight. Need prescription called into pharmacy for eliquis 5 mg  One tablet twice a day. Patient was seen 12/07/15 by Dr Gwenlyn Found.  RN e-sent to [facility] Patient states medication will be delivered by pharmacy tomorrow afternoon and she will miss her morning dose. What should she do? RN reviewed information with pharmacist Erasmo Downer. Per Erasmo Downer, patient is to take one 5 mg tablet tonight and one 5 mg tablet in the morning. Continue dose when prescription arrives.  Patient verbalized understanding.  Patient also states that since seeing doctor, looks more swollen and cool to the touch - has an appt 10/3/17for LEA-   RN spoke to scheduler moved  LEA appt up to 12/16/15 at 8 am. Patient aware and will there.

## 2015-12-15 ENCOUNTER — Other Ambulatory Visit: Payer: Self-pay | Admitting: Neurology

## 2015-12-15 DIAGNOSIS — G40209 Localization-related (focal) (partial) symptomatic epilepsy and epileptic syndromes with complex partial seizures, not intractable, without status epilepticus: Secondary | ICD-10-CM

## 2015-12-16 ENCOUNTER — Ambulatory Visit (HOSPITAL_COMMUNITY)
Admission: RE | Admit: 2015-12-16 | Discharge: 2015-12-16 | Disposition: A | Discharge status: Home / Self Care | Payer: Medicaid Other | Source: Ambulatory Visit | Attending: Cardiology | Admitting: Cardiology

## 2015-12-16 DIAGNOSIS — I10 Essential (primary) hypertension: Secondary | ICD-10-CM | POA: Insufficient documentation

## 2015-12-16 DIAGNOSIS — R0989 Other specified symptoms and signs involving the circulatory and respiratory systems: Secondary | ICD-10-CM | POA: Insufficient documentation

## 2015-12-16 DIAGNOSIS — J449 Chronic obstructive pulmonary disease, unspecified: Secondary | ICD-10-CM | POA: Diagnosis not present

## 2015-12-16 DIAGNOSIS — Z86718 Personal history of other venous thrombosis and embolism: Secondary | ICD-10-CM | POA: Insufficient documentation

## 2015-12-20 ENCOUNTER — Encounter (HOSPITAL_COMMUNITY): Payer: Medicaid Other

## 2015-12-28 ENCOUNTER — Other Ambulatory Visit: Payer: Self-pay

## 2015-12-28 ENCOUNTER — Ambulatory Visit: Payer: No Typology Code available for payment source | Admitting: Physical Therapy

## 2015-12-28 ENCOUNTER — Other Ambulatory Visit: Payer: Self-pay | Admitting: Neurology

## 2015-12-28 DIAGNOSIS — R519 Headache, unspecified: Secondary | ICD-10-CM

## 2015-12-28 DIAGNOSIS — R51 Headache: Principal | ICD-10-CM

## 2015-12-28 MED ORDER — CARISOPRODOL 250 MG PO TABS: ORAL_TABLET | ORAL | 5 refills | Status: DC

## 2015-12-28 NOTE — Telephone Encounter (Signed)
Rx sent to pharmacy   

## 2016-01-03 ENCOUNTER — Ambulatory Visit: Payer: Medicaid Other | Attending: Orthopedic Surgery | Admitting: Physical Therapy

## 2016-01-03 DIAGNOSIS — M79662 Pain in left lower leg: Secondary | ICD-10-CM | POA: Insufficient documentation

## 2016-01-03 DIAGNOSIS — R600 Localized edema: Secondary | ICD-10-CM | POA: Diagnosis not present

## 2016-01-03 NOTE — Patient Instructions (Signed)
Heel Slide    Bend knee and pull heel toward buttocks. Hold __10__ seconds. Return. Repeat with other knee. Repeat __10__ times. Do __2__ sessions per day.  http://gt2.exer.us/372   Copyright  VHI. All rights reserved.    Quad Set    With other leg bent, foot flat, slowly tighten muscles on thigh of straight leg while counting out loud to __10__. Repeat with other leg. Repeat _10___ times. Do __2__ sessions per day.  http://gt2.exer.us/276   Copyright  VHI. All rights reserved.  Ankle Pump    With left leg elevated, gently flex and extend ankle. Move through full range of motion. Avoid pain. Repeat __10__ times per set. Do ____2 sets per session. Do ___2_ sessions per day.  http://orth.exer.us/33   Copyright  VHI. All rights reserved.  Stretching: Hamstring (Supine)    Supporting right thigh behind knee, slowly straighten knee until stretch is felt in back of thigh. Hold _30___ seconds. Use a sheet to assist.   Repeat __3__ times per set. Do __1__ sets per session. Do __2__ sessions per day.  http://orth.exer.us/657   Copyright  VHI. All rights reserved.
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
## 2016-01-16 ENCOUNTER — Ambulatory Visit: Payer: Medicaid Other | Admitting: Physical Therapy

## 2016-01-18 ENCOUNTER — Other Ambulatory Visit: Payer: Self-pay | Admitting: Obstetrics & Gynecology

## 2016-01-18 DIAGNOSIS — N6489 Other specified disorders of breast: Secondary | ICD-10-CM

## 2016-01-24 ENCOUNTER — Encounter: Payer: Self-pay | Admitting: Cardiovascular Disease

## 2016-01-24 ENCOUNTER — Ambulatory Visit (INDEPENDENT_AMBULATORY_CARE_PROVIDER_SITE_OTHER): Payer: Medicaid Other | Admitting: Cardiovascular Disease

## 2016-01-24 ENCOUNTER — Encounter: Payer: Self-pay | Admitting: *Deleted

## 2016-01-24 VITALS — BP 122/76 | HR 93

## 2016-01-24 DIAGNOSIS — I825Z2 Chronic embolism and thrombosis of unspecified deep veins of left distal lower extremity: Secondary | ICD-10-CM

## 2016-01-24 NOTE — Patient Instructions (Signed)
Medication Instructions:  Your physician recommends that you continue on your current medications as directed. Please refer to the Current Medication list given to you today.   Testing/Procedures: Your physician has requested that you have a LEFT LOWER EXTREMITY venous duplex. This test is an ultrasound of the veins in the legs or arms. It looks at venous blood flow that carries blood from the heart to the legs or arms. Allow one hour for a Lower Venous exam. Allow thirty minutes for an Upper Venous exam. There are no restrictions or special instructions.  IN MID [facility] 2017.    Follow-Up: Your physician recommends that you schedule a follow-up appointment in: MID December AFTER THE DOPPLER WITH DR Gwenlyn Found.     If you need a refill on your cardiac medications before your next appointment, please call your pharmacy.

## 2016-01-24 NOTE — Progress Notes (Signed)
Suzanne Velazquez returns today for follow-up. Her arterial Dopplers studies showed triphasic waveforms. She did have left peroneal vein DVTs by venous Doppler study on 12/02/15 and was begun on Xarelto that was ultimately transitioned to Eliquis  oral anticoagulation. I believe her symptoms are related to her lumbar disc disease which she needs to be have surgically corrected. She'll be cleared for that from my point of view as well as to undergo physical therapy. We'll recheck left lower extremity venous Doppler studies in December to assess for resolution of her DVT at which time we will consider stopping her oral anticoagulation.

## 2016-01-24 NOTE — Assessment & Plan Note (Signed)
Suzanne Velazquez returns today for follow-up. Her arterial Dopplers studies showed triphasic waveforms. She did have left peroneal vein DVTs by venous Doppler study on 12/02/15 and was begun on Xarelto that was ultimately transitioned to Eliquis  oral anticoagulation. I believe her symptoms are related to her lumbar disc disease which she needs to be have surgically corrected. She'll be cleared for that from my point of view as well as to undergo physical therapy. We'll recheck left lower extremity venous Doppler studies in December to assess for resolution of her DVT at which time we will consider stopping her oral anticoagulation.

## 2016-02-22 ENCOUNTER — Other Ambulatory Visit: Payer: Medicaid Other

## 2016-02-29 ENCOUNTER — Ambulatory Visit
Admission: RE | Admit: 2016-02-29 | Discharge: 2016-02-29 | Disposition: A | Discharge status: Home / Self Care | Payer: Medicaid Other | Source: Ambulatory Visit | Attending: Obstetrics & Gynecology | Admitting: Obstetrics & Gynecology

## 2016-02-29 ENCOUNTER — Ambulatory Visit (HOSPITAL_COMMUNITY)
Admission: RE | Admit: 2016-02-29 | Discharge: 2016-02-29 | Disposition: A | Discharge status: Home / Self Care | Payer: Medicaid Other | Source: Ambulatory Visit | Attending: Cardiovascular Disease | Admitting: Cardiovascular Disease

## 2016-02-29 DIAGNOSIS — M7989 Other specified soft tissue disorders: Secondary | ICD-10-CM | POA: Diagnosis not present

## 2016-02-29 DIAGNOSIS — I825Z2 Chronic embolism and thrombosis of unspecified deep veins of left distal lower extremity: Secondary | ICD-10-CM | POA: Diagnosis present

## 2016-02-29 DIAGNOSIS — N6489 Other specified disorders of breast: Secondary | ICD-10-CM

## 2016-02-29 DIAGNOSIS — I82522 Chronic embolism and thrombosis of left iliac vein: Secondary | ICD-10-CM | POA: Diagnosis not present

## 2016-03-02 ENCOUNTER — Ambulatory Visit: Payer: Medicaid Other | Admitting: Cardiovascular Disease

## 2016-03-05 ENCOUNTER — Other Ambulatory Visit: Payer: Self-pay

## 2016-03-07 ENCOUNTER — Encounter (HOSPITAL_COMMUNITY): Payer: Medicaid Other

## 2016-03-13 ENCOUNTER — Ambulatory Visit: Payer: Medicaid Other | Admitting: Neurology

## 2016-03-23 ENCOUNTER — Encounter: Payer: Self-pay | Admitting: Neurology

## 2016-04-12 ENCOUNTER — Other Ambulatory Visit: Payer: Self-pay | Admitting: Neurological Surgery

## 2016-04-13 ENCOUNTER — Ambulatory Visit: Payer: Medicaid Other | Admitting: Cardiovascular Disease

## 2016-04-24 ENCOUNTER — Ambulatory Visit: Payer: Medicaid Other | Admitting: Cardiovascular Disease

## 2016-04-30 ENCOUNTER — Other Ambulatory Visit: Payer: Self-pay | Admitting: Neurology

## 2016-05-01 ENCOUNTER — Other Ambulatory Visit (HOSPITAL_COMMUNITY): Payer: Self-pay | Admitting: Neurological Surgery

## 2016-05-01 ENCOUNTER — Inpatient Hospital Stay (HOSPITAL_COMMUNITY): Admission: RE | Admit: 2016-05-01 | Payer: Medicaid Other | Source: Ambulatory Visit

## 2016-05-01 DIAGNOSIS — M4727 Other spondylosis with radiculopathy, lumbosacral region: Secondary | ICD-10-CM

## 2016-05-02 ENCOUNTER — Encounter (HOSPITAL_COMMUNITY): Payer: Self-pay

## 2016-05-02 ENCOUNTER — Encounter (HOSPITAL_COMMUNITY)
Admission: RE | Admit: 2016-05-02 | Discharge: 2016-05-02 | Disposition: A | Discharge status: Home / Self Care | Payer: Medicaid Other | Source: Ambulatory Visit | Attending: Neurological Surgery | Admitting: Neurological Surgery

## 2016-05-02 ENCOUNTER — Other Ambulatory Visit (HOSPITAL_COMMUNITY): Payer: Self-pay | Admitting: *Deleted

## 2016-05-02 DIAGNOSIS — M4727 Other spondylosis with radiculopathy, lumbosacral region: Secondary | ICD-10-CM | POA: Diagnosis not present

## 2016-05-02 DIAGNOSIS — Z01818 Encounter for other preprocedural examination: Secondary | ICD-10-CM | POA: Insufficient documentation

## 2016-05-02 HISTORY — DX: Peripheral vascular disease, unspecified: I73.9

## 2016-05-02 HISTORY — DX: Chronic obstructive pulmonary disease, unspecified: J44.9

## 2016-05-02 HISTORY — DX: Pneumonia, unspecified organism: J18.9

## 2016-05-02 HISTORY — DX: Injury of unspecified nerve at lower leg level, unspecified leg, initial encounter: S84.90XA

## 2016-05-02 LAB — BASIC METABOLIC PANEL
Anion gap: 7 (ref 5–15)
BUN: 9 mg/dL (ref 6–20)
CO2: 23 mmol/L (ref 22–32)
Calcium: 9.4 mg/dL (ref 8.9–10.3)
Chloride: 108 mmol/L (ref 101–111)
Creatinine, Ser: 0.8 mg/dL (ref 0.44–1.00)
GFR calc Af Amer: >60 mL/min (ref >60)
GFR calc non Af Amer: >60 mL/min (ref >60)
Glucose, Bld: 111 mg/dL — ABNORMAL HIGH (ref 65–99)
Potassium: 3.8 mmol/L (ref 3.5–5.1)
Sodium: 138 mmol/L (ref 135–145)

## 2016-05-02 LAB — HCG, SERUM, QUALITATIVE: Preg, Serum: NEGATIVE

## 2016-05-02 LAB — SURGICAL PCR SCREEN
MRSA, PCR: NEGATIVE
Staphylococcus aureus: POSITIVE — AB

## 2016-05-02 LAB — CBC
HCT: 39.5 % (ref 36.0–46.0)
Hemoglobin: 12.7 g/dL (ref 12.0–15.0)
MCH: 26.1 pg (ref 26.0–34.0)
MCHC: 32.2 g/dL (ref 30.0–36.0)
MCV: 81.1 fL (ref 78.0–100.0)
Platelets: 384 10*3/uL (ref 150–400)
RBC: 4.87 MIL/uL (ref 3.87–5.11)
RDW: 14 % (ref 11.5–15.5)
WBC: 11.3 10*3/uL — ABNORMAL HIGH (ref 4.0–10.5)

## 2016-05-02 LAB — TYPE AND SCREEN
ABO/RH(D): A POS
Antibody Screen: NEGATIVE

## 2016-05-02 LAB — PROTIME-INR
INR: 1.09
Prothrombin Time: 14.1 seconds (ref 11.4–15.2)

## 2016-05-02 LAB — APTT: aPTT: 31 seconds (ref 24–36)

## 2016-05-02 NOTE — Pre-Procedure Instructions (Addendum)
Suzanne Velazquez  05/02/2016    Your procedure is scheduled on Tuesday, May 08, 2016 .   Report to [facility] Entrance "A" Admitting Office at 5:30 AM.   Call this number if you have problems the morning of surgery: [phone]   Questions prior to day of surgery, please call [phone] between 8 & 4 PM.   Remember:  Do not eat food or drink liquids after midnight Monday, 05/07/16.  Take these medicines the morning of surgery with A SIP OF WATER:  Pantoprazole (Protonix) - if needed, Gabapentin (Neurontin) - if needed, Albuterol inhaler (ProAir) - if needed (bring inhaler with you morning of surgery)  Stop Aspirin Products (BC, Goody's, etc) and NSAIDS (Meloxicam, Celebrex, Ibuprofen, Aleve, etc)7days prior to surgery.also all vitamins/supplements   Do not wear jewelry, make-up or nail polish.  Do not wear lotions, powders or perfumes.  Do not shave 48 hours prior to surgery.    Do not bring valuables to the hospital.  [facility] is not responsible for any belongings or valuables.  Contacts, dentures or bridgework may not be worn into surgery.  Leave your suitcase in the car.  After surgery it may be brought to your room.  For patients admitted to the hospital, discharge time will be determined by your treatment team.  Special instructions:  [place] - Preparing for Surgery  Before surgery, you can play an important role.  Because skin is not sterile, your skin needs to be as free of germs as possible.  You can reduce the number of germs on you skin by washing with CHG (chlorahexidine gluconate) soap before surgery.  CHG is an antiseptic cleaner which kills germs and bonds with the skin to continue killing germs even after washing.  Please DO NOT use if you have an allergy to CHG or antibacterial soaps.  If your skin becomes reddened/irritated stop using the CHG and inform your nurse when you arrive at Short Stay.  Do not shave (including legs and underarms)  for at least 48 hours prior to the first CHG shower.  You may shave your face.  Please follow these instructions carefully:   1.  Shower with CHG Soap the night before surgery and the                    morning of Surgery.  2.  If you choose to wash your hair, wash your hair first as usual with your       normal shampoo.  3.  After you shampoo, rinse your hair and body thoroughly to remove the shampoo.  4.  Use CHG as you would any other liquid soap.  You can apply chg directly       to the skin and wash gently with scrungie or a clean washcloth.  5.  Apply the CHG Soap to your body ONLY FROM THE NECK DOWN.        Do not use on open wounds or open sores.  Avoid contact with your eyes, ears, mouth and genitals (private parts).  Wash genitals (private parts) with your normal soap.  6.  Wash thoroughly, paying special attention to the area where your surgery        will be performed.  7.  Thoroughly rinse your body with warm water from the neck down.  8.  DO NOT shower/wash with your normal soap after using and rinsing off       the CHG Soap.  9.  Pat yourself dry with a clean towel.            10.  Wear clean pajamas.            11.  Place clean sheets on your bed the night of your first shower and do not        sleep with pets.  Day of Surgery  Do not apply any lotions the morning of surgery.  Please wear clean clothes to the hospital.   Please read over the fact sheets that you were given.

## 2016-05-07 NOTE — Anesthesia Preprocedure Evaluation (Addendum)
Anesthesia Evaluation  Patient identified by MRN, date of birth, ID band Patient awake    Reviewed: Allergy & Precautions, NPO status , Patient's Chart, lab work & pertinent test results  History of Anesthesia Complications Negative for: history of anesthetic complications  Airway Mallampati: II  TM Distance: >3 FB Neck ROM: Full    Dental  (+) Edentulous Lower, Dental Advisory Given   Pulmonary asthma , COPD,    Pulmonary exam normal        Cardiovascular hypertension, + Peripheral Vascular Disease  Normal cardiovascular exam  Study Conclusions  - Left ventricle: The cavity size was normal. Wall thickness was   normal. Systolic function was normal. The estimated ejection   fraction was in the range of 60% to 65%. Wall motion was normal;   there were no regional wall motion abnormalities. Left   ventricular diastolic function parameters were normal.   Neuro/Psych Seizures -,  negative psych ROS   GI/Hepatic negative GI ROS, Neg liver ROS,   Endo/Other  negative endocrine ROS  Renal/GU negative Renal ROS     Musculoskeletal negative musculoskeletal ROS (+)   Abdominal   Peds  Hematology negative hematology ROS (+)   Anesthesia Other Findings Day of surgery medications reviewed with the patient.  Reproductive/Obstetrics                            Anesthesia Physical Anesthesia Plan  ASA: III  Anesthesia Plan: General   Post-op Pain Management:    Induction: Intravenous  Airway Management Planned: Oral ETT  Additional Equipment:   Intra-op Plan:   Post-operative Plan: Extubation in OR  Informed Consent: I have reviewed the patients History and Physical, chart, labs and discussed the procedure including the risks, benefits and alternatives for the proposed anesthesia with the patient or authorized representative who has indicated his/her understanding and acceptance.   Dental  advisory given  Plan Discussed with: CRNA and Anesthesiologist  Anesthesia Plan Comments:        Anesthesia Quick Evaluation

## 2016-05-08 ENCOUNTER — Inpatient Hospital Stay (HOSPITAL_COMMUNITY): Payer: Medicaid Other

## 2016-05-08 ENCOUNTER — Inpatient Hospital Stay (HOSPITAL_COMMUNITY): Payer: Medicaid Other | Admitting: Anesthesiology

## 2016-05-08 ENCOUNTER — Encounter (HOSPITAL_COMMUNITY): Payer: Self-pay | Admitting: Certified Registered Nurse Anesthetist

## 2016-05-08 ENCOUNTER — Inpatient Hospital Stay (HOSPITAL_COMMUNITY)
Admission: RE | Admit: 2016-05-08 | Discharge: 2016-05-09 | DRG: 460 | Disposition: A | Discharge status: Home / Self Care | Payer: Medicaid Other | Source: Ambulatory Visit | Attending: Neurological Surgery | Admitting: Neurological Surgery

## 2016-05-08 ENCOUNTER — Encounter (HOSPITAL_COMMUNITY)
Admission: RE | Disposition: A | Discharge status: Home / Self Care | Payer: Self-pay | Source: Ambulatory Visit | Attending: Neurological Surgery

## 2016-05-08 DIAGNOSIS — M79609 Pain in unspecified limb: Secondary | ICD-10-CM

## 2016-05-08 DIAGNOSIS — I739 Peripheral vascular disease, unspecified: Secondary | ICD-10-CM | POA: Diagnosis present

## 2016-05-08 DIAGNOSIS — M5136 Other intervertebral disc degeneration, lumbar region: Principal | ICD-10-CM | POA: Diagnosis present

## 2016-05-08 DIAGNOSIS — M797 Fibromyalgia: Secondary | ICD-10-CM | POA: Diagnosis present

## 2016-05-08 DIAGNOSIS — I509 Heart failure, unspecified: Secondary | ICD-10-CM | POA: Diagnosis present

## 2016-05-08 DIAGNOSIS — M069 Rheumatoid arthritis, unspecified: Secondary | ICD-10-CM | POA: Diagnosis present

## 2016-05-08 DIAGNOSIS — M4727 Other spondylosis with radiculopathy, lumbosacral region: Secondary | ICD-10-CM | POA: Diagnosis present

## 2016-05-08 DIAGNOSIS — Z885 Allergy status to narcotic agent status: Secondary | ICD-10-CM | POA: Diagnosis not present

## 2016-05-08 DIAGNOSIS — J449 Chronic obstructive pulmonary disease, unspecified: Secondary | ICD-10-CM | POA: Diagnosis present

## 2016-05-08 DIAGNOSIS — Z7951 Long term (current) use of inhaled steroids: Secondary | ICD-10-CM

## 2016-05-08 DIAGNOSIS — Z79899 Other long term (current) drug therapy: Secondary | ICD-10-CM

## 2016-05-08 DIAGNOSIS — Z419 Encounter for procedure for purposes other than remedying health state, unspecified: Secondary | ICD-10-CM

## 2016-05-08 DIAGNOSIS — M4317 Spondylolisthesis, lumbosacral region: Secondary | ICD-10-CM | POA: Diagnosis present

## 2016-05-08 DIAGNOSIS — Z882 Allergy status to sulfonamides status: Secondary | ICD-10-CM

## 2016-05-08 DIAGNOSIS — Z7982 Long term (current) use of aspirin: Secondary | ICD-10-CM

## 2016-05-08 DIAGNOSIS — Z888 Allergy status to other drugs, medicaments and biological substances status: Secondary | ICD-10-CM | POA: Diagnosis not present

## 2016-05-08 DIAGNOSIS — M5416 Radiculopathy, lumbar region: Secondary | ICD-10-CM | POA: Diagnosis present

## 2016-05-08 DIAGNOSIS — R600 Localized edema: Secondary | ICD-10-CM

## 2016-05-08 DIAGNOSIS — M7989 Other specified soft tissue disorders: Secondary | ICD-10-CM | POA: Diagnosis not present

## 2016-05-08 DIAGNOSIS — I110 Hypertensive heart disease with heart failure: Secondary | ICD-10-CM | POA: Diagnosis present

## 2016-05-08 DIAGNOSIS — M549 Dorsalgia, unspecified: Secondary | ICD-10-CM | POA: Diagnosis present

## 2016-05-08 HISTORY — PX: ANTERIOR LAT LUMBAR FUSION: SHX1168

## 2016-05-08 SURGERY — ANTERIOR LATERAL LUMBAR FUSION 1 LEVEL
Anesthesia: General | Site: Back

## 2016-05-08 MED ORDER — BUPIVACAINE HCL 0.25 % IJ SOLN
INTRAMUSCULAR | Status: DC | PRN
  Administered 2016-05-08: 5 mL

## 2016-05-08 MED ORDER — HEMOSTATIC AGENTS (NO CHARGE) OPTIME
TOPICAL | Status: DC | PRN
  Administered 2016-05-08: 1 via TOPICAL

## 2016-05-08 MED ORDER — PROMETHAZINE HCL 25 MG/ML IJ SOLN
INTRAMUSCULAR | Status: AC
  Administered 2016-05-08: 6.25 mg via INTRAVENOUS
  Filled 2016-05-08: qty 1

## 2016-05-08 MED ORDER — VITAMIN D3 25 MCG (1000 UNIT) PO TABS
1000.0000 [IU] | ORAL_TABLET | Freq: Every day | ORAL | Status: DC
  Administered 2016-05-08: 1000 [IU] via ORAL
  Filled 2016-05-08 (×3): qty 1

## 2016-05-08 MED ORDER — ROCURONIUM BROMIDE 50 MG/5ML IV SOSY
PREFILLED_SYRINGE | INTRAVENOUS | Status: AC
  Filled 2016-05-08: qty 5

## 2016-05-08 MED ORDER — MELOXICAM 7.5 MG PO TABS
15.0000 mg | ORAL_TABLET | Freq: Every day | ORAL | Status: DC
  Administered 2016-05-08: 15 mg via ORAL
  Filled 2016-05-08 (×2): qty 1

## 2016-05-08 MED ORDER — THROMBIN 5000 UNITS EX SOLR
CUTANEOUS | Status: AC
  Filled 2016-05-08: qty 5000

## 2016-05-08 MED ORDER — HYDROMORPHONE HCL 1 MG/ML IJ SOLN
INTRAMUSCULAR | Status: AC
  Administered 2016-05-08: 0.5 mg via INTRAVENOUS
  Filled 2016-05-08: qty 1

## 2016-05-08 MED ORDER — FENTANYL CITRATE (PF) 100 MCG/2ML IJ SOLN
INTRAMUSCULAR | Status: DC | PRN
  Administered 2016-05-08 (×2): 25 ug via INTRAVENOUS
  Administered 2016-05-08 (×2): 50 ug via INTRAVENOUS
  Administered 2016-05-08: 150 ug via INTRAVENOUS

## 2016-05-08 MED ORDER — BUPIVACAINE-EPINEPHRINE (PF) 0.5% -1:200000 IJ SOLN
INTRAMUSCULAR | Status: AC
Start: 2016-05-08 — End: 2016-05-08
  Filled 2016-05-08: qty 30

## 2016-05-08 MED ORDER — PANTOPRAZOLE SODIUM 20 MG PO TBEC: 20.0000 mg | DELAYED_RELEASE_TABLET | Freq: Every day | ORAL | Status: DC | PRN

## 2016-05-08 MED ORDER — SODIUM CHLORIDE 0.9% FLUSH: 3.0000 mL | Freq: Two times a day (BID) | INTRAVENOUS | Status: DC

## 2016-05-08 MED ORDER — PROPOFOL 10 MG/ML IV BOLUS
INTRAVENOUS | Status: DC | PRN
  Administered 2016-05-08: 170 mg via INTRAVENOUS

## 2016-05-08 MED ORDER — BISACODYL 10 MG RE SUPP: 10.0000 mg | Freq: Every day | RECTAL | Status: DC | PRN

## 2016-05-08 MED ORDER — LACTATED RINGERS IV SOLN
INTRAVENOUS | Status: DC | PRN
  Administered 2016-05-08: 07:00:00 via INTRAVENOUS

## 2016-05-08 MED ORDER — CHLORHEXIDINE GLUCONATE CLOTH 2 % EX PADS: 6.0000 | MEDICATED_PAD | Freq: Once | CUTANEOUS | Status: DC

## 2016-05-08 MED ORDER — DIPHENHYDRAMINE HCL 25 MG PO CAPS
25.0000 mg | ORAL_CAPSULE | Freq: Four times a day (QID) | ORAL | Status: DC | PRN
  Administered 2016-05-08: 25 mg via ORAL
  Filled 2016-05-08: qty 1

## 2016-05-08 MED ORDER — MIDAZOLAM HCL 5 MG/5ML IJ SOLN
INTRAMUSCULAR | Status: DC | PRN
  Administered 2016-05-08: 2 mg via INTRAVENOUS

## 2016-05-08 MED ORDER — SODIUM CHLORIDE 0.9% FLUSH: 3.0000 mL | INTRAVENOUS | Status: DC | PRN

## 2016-05-08 MED ORDER — SUCCINYLCHOLINE CHLORIDE 20 MG/ML IJ SOLN
INTRAMUSCULAR | Status: DC | PRN
  Administered 2016-05-08: 120 mg via INTRAVENOUS

## 2016-05-08 MED ORDER — PHENYLEPHRINE 40 MCG/ML (10ML) SYRINGE FOR IV PUSH (FOR BLOOD PRESSURE SUPPORT)
PREFILLED_SYRINGE | INTRAVENOUS | Status: AC
Start: 2016-05-08 — End: 2016-05-08
  Filled 2016-05-08: qty 10

## 2016-05-08 MED ORDER — CYCLOBENZAPRINE HCL 10 MG PO TABS
10.0000 mg | ORAL_TABLET | Freq: Two times a day (BID) | ORAL | Status: DC | PRN
  Administered 2016-05-09: 10 mg via ORAL
  Filled 2016-05-08: qty 1

## 2016-05-08 MED ORDER — ONDANSETRON HCL 4 MG/2ML IJ SOLN: 4.0000 mg | Freq: Four times a day (QID) | INTRAMUSCULAR | Status: DC | PRN

## 2016-05-08 MED ORDER — ESMOLOL HCL 100 MG/10ML IV SOLN
INTRAVENOUS | Status: DC | PRN
  Administered 2016-05-08: 50 mg via INTRAVENOUS

## 2016-05-08 MED ORDER — ONDANSETRON HCL 4 MG PO TABS: 4.0000 mg | ORAL_TABLET | Freq: Two times a day (BID) | ORAL | Status: DC | PRN

## 2016-05-08 MED ORDER — ONDANSETRON HCL 4 MG PO TABS: 4.0000 mg | ORAL_TABLET | Freq: Four times a day (QID) | ORAL | Status: DC | PRN

## 2016-05-08 MED ORDER — FENTANYL CITRATE (PF) 100 MCG/2ML IJ SOLN
INTRAMUSCULAR | Status: AC
Start: 2016-05-08 — End: 2016-05-08
  Filled 2016-05-08: qty 4

## 2016-05-08 MED ORDER — LIDOCAINE-EPINEPHRINE 2 %-1:100000 IJ SOLN
INTRAMUSCULAR | Status: DC | PRN
  Administered 2016-05-08: 5 mL

## 2016-05-08 MED ORDER — ACETAMINOPHEN 650 MG RE SUPP: 650.0000 mg | Freq: Four times a day (QID) | RECTAL | Status: DC | PRN

## 2016-05-08 MED ORDER — POLYETHYLENE GLYCOL 3350 17 G PO PACK: 17.0000 g | PACK | Freq: Every day | ORAL | Status: DC | PRN

## 2016-05-08 MED ORDER — FUROSEMIDE 20 MG PO TABS
20.0000 mg | ORAL_TABLET | Freq: Every day | ORAL | Status: DC
  Administered 2016-05-08: 20 mg via ORAL
  Filled 2016-05-08: qty 1

## 2016-05-08 MED ORDER — DEXAMETHASONE SODIUM PHOSPHATE 10 MG/ML IJ SOLN
INTRAMUSCULAR | Status: AC
  Filled 2016-05-08: qty 1

## 2016-05-08 MED ORDER — EPHEDRINE 5 MG/ML INJ
INTRAVENOUS | Status: AC
  Filled 2016-05-08: qty 20

## 2016-05-08 MED ORDER — LACTATED RINGERS IV SOLN
INTRAVENOUS | Status: DC | PRN
  Administered 2016-05-08: 09:00:00 via INTRAVENOUS

## 2016-05-08 MED ORDER — FENTANYL CITRATE (PF) 100 MCG/2ML IJ SOLN
INTRAMUSCULAR | Status: AC
  Filled 2016-05-08: qty 2

## 2016-05-08 MED ORDER — LIDOCAINE-EPINEPHRINE (PF) 2 %-1:200000 IJ SOLN
INTRAMUSCULAR | Status: AC
  Filled 2016-05-08: qty 20

## 2016-05-08 MED ORDER — LIDOCAINE HCL (CARDIAC) 20 MG/ML IV SOLN
INTRAVENOUS | Status: DC | PRN
  Administered 2016-05-08: 80 mg via INTRAVENOUS

## 2016-05-08 MED ORDER — SCOPOLAMINE 1 MG/3DAYS TD PT72
MEDICATED_PATCH | TRANSDERMAL | Status: AC
  Filled 2016-05-08: qty 1

## 2016-05-08 MED ORDER — EPHEDRINE SULFATE 50 MG/ML IJ SOLN
INTRAMUSCULAR | Status: DC | PRN
  Administered 2016-05-08 (×2): 5 mg via INTRAVENOUS

## 2016-05-08 MED ORDER — SCOPOLAMINE 1 MG/3DAYS TD PT72
1.0000 | MEDICATED_PATCH | TRANSDERMAL | Status: DC
  Administered 2016-05-08: 1.5 mg via TRANSDERMAL

## 2016-05-08 MED ORDER — LACOSAMIDE 50 MG PO TABS
100.0000 mg | ORAL_TABLET | Freq: Two times a day (BID) | ORAL | Status: DC
  Filled 2016-05-08: qty 2

## 2016-05-08 MED ORDER — DEXAMETHASONE SODIUM PHOSPHATE 10 MG/ML IJ SOLN
INTRAMUSCULAR | Status: DC | PRN
  Administered 2016-05-08: 10 mg via INTRAVENOUS

## 2016-05-08 MED ORDER — ALBUTEROL SULFATE (2.5 MG/3ML) 0.083% IN NEBU: 2.5000 mg | INHALATION_SOLUTION | RESPIRATORY_TRACT | Status: DC | PRN

## 2016-05-08 MED ORDER — ONDANSETRON HCL 4 MG/2ML IJ SOLN
INTRAMUSCULAR | Status: AC
  Filled 2016-05-08: qty 2

## 2016-05-08 MED ORDER — PROMETHAZINE HCL 25 MG/ML IJ SOLN
6.2500 mg | INTRAMUSCULAR | Status: AC | PRN
Start: 2016-05-08 — End: 2016-05-08
  Administered 2016-05-08 (×2): 6.25 mg via INTRAVENOUS

## 2016-05-08 MED ORDER — ONDANSETRON HCL 4 MG/2ML IJ SOLN
INTRAMUSCULAR | Status: DC | PRN
  Administered 2016-05-08: 4 mg via INTRAVENOUS

## 2016-05-08 MED ORDER — THROMBIN 5000 UNITS EX SOLR
CUTANEOUS | Status: DC | PRN
  Administered 2016-05-08 (×2): 5000 [IU] via TOPICAL

## 2016-05-08 MED ORDER — LIDOCAINE 2% (20 MG/ML) 5 ML SYRINGE
INTRAMUSCULAR | Status: AC
  Filled 2016-05-08: qty 5

## 2016-05-08 MED ORDER — ZOLPIDEM TARTRATE 5 MG PO TABS: 5.0000 mg | ORAL_TABLET | Freq: Every evening | ORAL | Status: DC | PRN

## 2016-05-08 MED ORDER — THROMBIN 5000 UNITS EX SOLR
OROMUCOSAL | Status: DC | PRN
  Administered 2016-05-08: 5 mL via TOPICAL

## 2016-05-08 MED ORDER — ACETAMINOPHEN 325 MG PO TABS: 650.0000 mg | ORAL_TABLET | Freq: Four times a day (QID) | ORAL | Status: DC | PRN

## 2016-05-08 MED ORDER — BUPIVACAINE HCL (PF) 0.25 % IJ SOLN
INTRAMUSCULAR | Status: AC
  Filled 2016-05-08: qty 30

## 2016-05-08 MED ORDER — PHENYLEPHRINE HCL 10 MG/ML IJ SOLN
INTRAMUSCULAR | Status: DC | PRN
  Administered 2016-05-08 (×2): 80 ug via INTRAVENOUS

## 2016-05-08 MED ORDER — THROMBIN 5000 UNITS EX SOLR
CUTANEOUS | Status: AC
  Filled 2016-05-08: qty 10000

## 2016-05-08 MED ORDER — OXYCODONE HCL 5 MG PO TABS
5.0000 mg | ORAL_TABLET | ORAL | Status: DC | PRN
  Administered 2016-05-08 – 2016-05-09 (×2): 10 mg via ORAL
  Filled 2016-05-08 (×2): qty 2

## 2016-05-08 MED ORDER — ARTIFICIAL TEARS OP OINT
TOPICAL_OINTMENT | OPHTHALMIC | Status: DC | PRN
  Administered 2016-05-08: 1 via OPHTHALMIC

## 2016-05-08 MED ORDER — BUPIVACAINE-EPINEPHRINE 0.5% -1:200000 IJ SOLN
INTRAMUSCULAR | Status: DC | PRN
  Administered 2016-05-08: 5 mL

## 2016-05-08 MED ORDER — SUCCINYLCHOLINE CHLORIDE 200 MG/10ML IV SOSY
PREFILLED_SYRINGE | INTRAVENOUS | Status: AC
  Filled 2016-05-08: qty 20

## 2016-05-08 MED ORDER — 0.9 % SODIUM CHLORIDE (POUR BTL) OPTIME
TOPICAL | Status: DC | PRN
  Administered 2016-05-08: 1000 mL

## 2016-05-08 MED ORDER — HYDROCODONE-ACETAMINOPHEN 5-325 MG PO TABS
1.0000 | ORAL_TABLET | ORAL | Status: DC | PRN
  Administered 2016-05-08: 2 via ORAL
  Filled 2016-05-08: qty 2

## 2016-05-08 MED ORDER — HYDROMORPHONE HCL 1 MG/ML IJ SOLN
0.5000 mg | INTRAMUSCULAR | Status: DC | PRN
  Administered 2016-05-08 (×2): 0.5 mg via INTRAVENOUS

## 2016-05-08 MED ORDER — GABAPENTIN 300 MG PO CAPS
300.0000 mg | ORAL_CAPSULE | Freq: Three times a day (TID) | ORAL | Status: DC
  Administered 2016-05-08: 300 mg via ORAL
  Filled 2016-05-08 (×2): qty 1

## 2016-05-08 MED ORDER — FLEET ENEMA 7-19 GM/118ML RE ENEM: 1.0000 | ENEMA | Freq: Once | RECTAL | Status: DC | PRN

## 2016-05-08 MED ORDER — CEFAZOLIN SODIUM-DEXTROSE 2-4 GM/100ML-% IV SOLN
2.0000 g | INTRAVENOUS | Status: AC
  Administered 2016-05-08: 2 g via INTRAVENOUS
  Filled 2016-05-08: qty 100

## 2016-05-08 MED ORDER — SODIUM CHLORIDE 0.9 % IR SOLN
Status: DC | PRN
  Administered 2016-05-08: 500 mL

## 2016-05-08 MED ORDER — SODIUM CHLORIDE 0.9 % IV SOLN
250.0000 mL | INTRAVENOUS | Status: DC | PRN
  Administered 2016-05-08: 10 mL via INTRAVENOUS

## 2016-05-08 MED ORDER — SENNA 8.6 MG PO TABS
1.0000 | ORAL_TABLET | Freq: Two times a day (BID) | ORAL | Status: DC
  Administered 2016-05-08: 8.6 mg via ORAL
  Filled 2016-05-08: qty 1

## 2016-05-08 MED ORDER — PROPOFOL 10 MG/ML IV BOLUS
INTRAVENOUS | Status: AC
  Filled 2016-05-08: qty 20

## 2016-05-08 MED ORDER — MIDAZOLAM HCL 2 MG/2ML IJ SOLN
INTRAMUSCULAR | Status: AC
  Filled 2016-05-08: qty 2

## 2016-05-08 MED ORDER — PHENYLEPHRINE HCL 10 MG/ML IJ SOLN
INTRAVENOUS | Status: DC | PRN
  Administered 2016-05-08: 20 ug/min via INTRAVENOUS

## 2016-05-08 MED ORDER — HYDROMORPHONE HCL 1 MG/ML IJ SOLN
0.2500 mg | INTRAMUSCULAR | Status: DC | PRN
  Administered 2016-05-08 (×4): 0.5 mg via INTRAVENOUS

## 2016-05-08 SURGICAL SUPPLY — 76 items
ADH SKN CLS APL DERMABOND .7 (GAUZE/BANDAGES/DRESSINGS) ×2
BIT DRILL LONG 3.0X30 (BIT) IMPLANT
BIT DRILL LONG 3X80 (BIT) IMPLANT
BIT DRILL LONG 4X80 (BIT) IMPLANT
BIT DRILL SHORT 3.0X30 (BIT) IMPLANT
BIT DRILL SHORT 3X80 (BIT) IMPLANT
BLADE CLIPPER SURG (BLADE) IMPLANT
BLADE SURG 11 STRL SS (BLADE) ×3 IMPLANT
BOLT 5.0X45 SPINAL (Bolt) ×4 IMPLANT
BOLT SPNL LRG 45X5.5XPLAT NS (Screw) ×1 IMPLANT
CARTRIDGE OIL MAESTRO DRILL (MISCELLANEOUS) ×2 IMPLANT
CHLORAPREP W/TINT 26ML (MISCELLANEOUS) ×3 IMPLANT
CONT SPECI 4OZ STER CLIK (MISCELLANEOUS) ×2 IMPLANT
COVER BACK TABLE 24X17X13 BIG (DRAPES) IMPLANT
DECANTER SPIKE VIAL GLASS SM (MISCELLANEOUS) ×7 IMPLANT
DERMABOND ADVANCED (GAUZE/BANDAGES/DRESSINGS) ×1
DERMABOND ADVANCED .7 DNX12 (GAUZE/BANDAGES/DRESSINGS) ×3 IMPLANT
DIFFUSER DRILL AIR PNEUMATIC (MISCELLANEOUS) ×3 IMPLANT
DRAPE C-ARM 42X72 X-RAY (DRAPES) ×3 IMPLANT
DRAPE C-ARMOR (DRAPES) ×3 IMPLANT
DRAPE LAPAROTOMY 100X72X124 (DRAPES) ×3 IMPLANT
DRAPE POUCH INSTRU U-SHP 10X18 (DRAPES) ×3 IMPLANT
DRAPE SHEET LG 3/4 BI-LAMINATE (DRAPES) ×3 IMPLANT
DRSG OPSITE POSTOP 4X6 (GAUZE/BANDAGES/DRESSINGS) ×2 IMPLANT
ELECT REM PT RETURN 9FT ADLT (ELECTROSURGICAL) ×3
ELECTRODE REM PT RTRN 9FT ADLT (ELECTROSURGICAL) ×2 IMPLANT
GAUZE SPONGE 4X4 16PLY XRAY LF (GAUZE/BANDAGES/DRESSINGS) IMPLANT
GLOVE BIOGEL PI IND STRL 7.5 (GLOVE) ×4 IMPLANT
GLOVE BIOGEL PI INDICATOR 7.5 (GLOVE) ×3
GLOVE EXAM NITRILE LRG STRL (GLOVE) IMPLANT
GLOVE INDICATOR 7.0 STRL GRN (GLOVE) ×6 IMPLANT
GLOVE SS BIOGEL STRL SZ 7.5 (GLOVE) ×3 IMPLANT
GLOVE SUPERSENSE BIOGEL SZ 7.5 (GLOVE) ×2
GOWN STRL REUS W/ TWL LRG LVL3 (GOWN DISPOSABLE) ×3 IMPLANT
GOWN STRL REUS W/ TWL XL LVL3 (GOWN DISPOSABLE) ×2 IMPLANT
GOWN STRL REUS W/TWL 2XL LVL3 (GOWN DISPOSABLE) IMPLANT
GOWN STRL REUS W/TWL LRG LVL3 (GOWN DISPOSABLE) ×6
GOWN STRL REUS W/TWL XL LVL3 (GOWN DISPOSABLE) ×3
HEMOSTAT POWDER KIT SURGIFOAM (HEMOSTASIS) ×2 IMPLANT
KIT BASIN OR (CUSTOM PROCEDURE TRAY) ×3 IMPLANT
KIT DILATOR XLIF 5 (KITS) ×1 IMPLANT
KIT INFUSE XX SMALL 0.7CC (Orthopedic Implant) ×2 IMPLANT
KIT ROOM TURNOVER OR (KITS) ×3 IMPLANT
KIT SPINE MAZOR X ROBO DISP (MISCELLANEOUS) ×3 IMPLANT
KIT SURGICAL ACCESS MAXCESS 4 (KITS) ×2 IMPLANT
KIT XLIF (KITS) ×1
MODULE NVM5 NEXT GEN EMG (NEEDLE) ×2 IMPLANT
MODULUS XLW 10X22X50MM 10DEG (Spine Construct) ×2 IMPLANT
NDL HYPO 21X1.5 SAFETY (NEEDLE) ×1 IMPLANT
NDL HYPO 25X1 1.5 SAFETY (NEEDLE) ×1 IMPLANT
NEEDLE HYPO 21X1.5 SAFETY (NEEDLE) ×3 IMPLANT
NEEDLE HYPO 25X1 1.5 SAFETY (NEEDLE) ×3 IMPLANT
NS IRRIG 1000ML POUR BTL (IV SOLUTION) ×3 IMPLANT
OIL CARTRIDGE MAESTRO DRILL (MISCELLANEOUS) ×3
PACK LAMINECTOMY NEURO (CUSTOM PROCEDURE TRAY) ×3 IMPLANT
PIN HEAD 2.5X60MM (PIN) IMPLANT
PLATE 4H DECADE SPINAL (Plate) ×2 IMPLANT
PUTTY BONE ATTRAX 5CC STRIP (Putty) ×2 IMPLANT
SCREW  6.5X35MM LRG (Screw) ×1 IMPLANT
SCREW 45MM (Screw) ×3 IMPLANT
SCREW 6.5X35MM LRG (Screw) ×1 IMPLANT
SCREW SCHANZ SA 4.0MM (MISCELLANEOUS) IMPLANT
SPONGE LAP 4X18 X RAY DECT (DISPOSABLE) IMPLANT
STAPLER VISISTAT 35W (STAPLE) ×3 IMPLANT
SUT STRATAFIX MNCRL+ 3-0 PS-2 (SUTURE) ×2
SUT STRATAFIX MONOCRYL 3-0 (SUTURE) ×1
SUT VIC AB 1 CT1 18XBRD ANBCTR (SUTURE) ×2 IMPLANT
SUT VIC AB 1 CT1 8-18 (SUTURE) ×3
SUT VIC AB 2-0 CT1 18 (SUTURE) ×3 IMPLANT
SUT VIC AB 3-0 SH 8-18 (SUTURE) ×3 IMPLANT
SUTURE STRATFX MNCRL+ 3-0 PS-2 (SUTURE) ×1 IMPLANT
SYR 30ML LL (SYRINGE) ×3 IMPLANT
TOWEL OR 17X24 6PK STRL BLUE (TOWEL DISPOSABLE) ×3 IMPLANT
TOWEL OR 17X26 10 PK STRL BLUE (TOWEL DISPOSABLE) ×3 IMPLANT
TRAY FOLEY W/METER SILVER 16FR (SET/KITS/TRAYS/PACK) ×3 IMPLANT
WATER STERILE IRR 1000ML POUR (IV SOLUTION) ×3 IMPLANT

## 2016-05-08 NOTE — Anesthesia Procedure Notes (Addendum)
Procedure Name: Intubation Date/Time: 05/08/2016 8:33 AM Performed by: Salli Quarry Diarra Ceja Pre-anesthesia Checklist: Patient identified, Emergency Drugs available, Suction available and Patient being monitored Patient Re-evaluated:Patient Re-evaluated prior to inductionOxygen Delivery Method: Circle System Utilized Preoxygenation: Pre-oxygenation with 100% oxygen Intubation Type: IV induction Ventilation: Mask ventilation without difficulty Laryngoscope Size: Mac and 4 Grade View: Grade I Tube type: Oral Tube size: 7.0 mm Number of attempts: 1 Airway Equipment and Method: Stylet Placement Confirmation: ETT inserted through vocal cords under direct vision,  positive ETCO2 and breath sounds checked- equal and bilateral Secured at: 23 cm Tube secured with: Tape Dental Injury: Teeth and Oropharynx as per pre-operative assessment  Comments: Intubation performed by Kelli Hope, SRNA

## 2016-05-08 NOTE — Progress Notes (Signed)
**  Preliminary report by tech**  Left lower extremity venous duplex complete. There is no obvious evidence of deep or superficial vein thrombosis involving the left lower extremity. All clearly visualized vessels appear patent and compressible. There is no evidence of a Baker's cyst on the left.  05/08/16 8:15 AM Suzanne Velazquez RVT

## 2016-05-08 NOTE — Op Note (Signed)
05/08/2016  10:49 AM  PATIENT:  Suzanne Velazquez  46 y.o. female  PRE-OPERATIVE DIAGNOSIS:  Lumbosacral spondylosis with radiculopathy, spondylolisthesis of lumbosacral region, degenerative disc disease L3-4; use of BMP  POST-OPERATIVE DIAGNOSIS:  Same  PROCEDURE:  L3-4 transpsoas lateral interbody fusion with lateral plate fixation   SURGEON:  Aldean Ast, MD  ASSISTANTS: Consuella Lose, MD  ANESTHESIA:   General  DRAINS: None   SPECIMEN:  None  INDICATION FOR PROCEDURE: 46 year old woman with back and leg pain attributable to adjacent segment disease at L3-4 above her prior L4-5 fusion.  I recommended the above operation. Patient understood the risks, benefits, and alternatives and potential outcomes and wished to proceed.  PROCEDURE DETAILS: The patient was brought to the operating room.  After smooth induction of general endotracheal anesthesia the patient was placed in the lateral position with the left side up and secured with tape.  Localizing views were taken with fluoroscopy.  The operative site was then prepped and draped in the usual sterile fashion.  The planned incisions were infiltrated with lidocaine with epinephrine.   The skin was sharply incised.  The fat was opened with monopolar cautery.  The oblique abdominal muscles were opened with blunt dissection and spreading with Metzenbaum scissors.  I encountered an easily dissected fat plane, consistent with the retroperitoneum.  I inserted the dilator and approached the middle third of the L3-4 disc space.  I confirmed that I was not in contact with any nerves of the lumbar plexus.  The tract was sequentially dilated and then the retractor was introduced.  I incised the disc space and then passed a Cobb elevator along each endplate and penetrated the annulus on the contralateral side.  I then used box cutters, a paddle, and a rasp to complete the discectomy.  A trial spacer was used to determine appropriate graft  size and then a titanium lordotic interbody graft packed with beta tricalcium phosphate and BMP was inserted.  The retractor was removed after hemostasis was confirmed.   A four hole plate was inserted into the field and oriented appropriately.  Screw trajectories were created with an awl.  Four screws were inserted to secure the plate.  I felt the anterior inferior screw was too anterior and so it was removed, replace with a shorter screw in a more posterior trajectory.  I irrigated with bacitracin saline.  In instilled 0.25% marcaine in the disc space.   The incisions were closed in layers with interrupted vicryl sutures.  The skin was closed with running monocryl stratafix sutures and dermabond.  The patient was then turned supine on a stretcher.  PATIENT DISPOSITION:  PACU - hemodynamically stable.   Delay start of Pharmacological VTE agent (>24hrs) due to surgical blood loss or risk of bleeding:  yes

## 2016-05-08 NOTE — Transfer of Care (Signed)
Immediate Anesthesia Transfer of Care Note  Patient: Suzanne Velazquez  Procedure(s) Performed: Procedure(s): Lumbar three-four  Anterolateral lumbar interbody fusion with plate (N/A)  Patient Location: PACU  Anesthesia Type:General  Level of Consciousness: awake, alert , oriented and patient cooperative  Airway & Oxygen Therapy: Patient Spontanous Breathing and Patient connected to nasal cannula oxygen  Post-op Assessment: Report given to RN and Post -op Vital signs reviewed and stable  Post vital signs: Reviewed and stable  Last Vitals:  Vitals:   05/08/16 0627  BP: 130/81  Pulse: 97  Resp: 20  Temp: 36.8 C    Last Pain:  Vitals:   05/08/16 0627  TempSrc: Oral  PainSc:       Patients Stated Pain Goal: 6 ([id number])  Complications: No apparent anesthesia complications
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
## 2016-05-08 NOTE — Addendum Note (Signed)
Addendum  created 05/08/16 1347 by Sammie Bench, CRNA   Order sets accessed

## 2016-05-08 NOTE — Progress Notes (Signed)
Foley d/c prior to d/c from pacu

## 2016-05-08 NOTE — Anesthesia Postprocedure Evaluation (Addendum)
Anesthesia Post Note  Patient: Suzanne Velazquez  Procedure(s) Performed: Procedure(s) (LRB): Lumbar three-four  Anterolateral lumbar interbody fusion with plate (N/A)  Patient location during evaluation: PACU Anesthesia Type: General Level of consciousness: sedated Pain management: pain level controlled Vital Signs Assessment: post-procedure vital signs reviewed and stable Respiratory status: spontaneous breathing and respiratory function stable Cardiovascular status: stable Anesthetic complications: no       Last Vitals:  Vitals:   05/08/16 1300 05/08/16 1315  BP: 123/80   Pulse: 86 92  Resp: 12 14  Temp:      Last Pain:  Vitals:   05/08/16 1245  TempSrc:   PainSc: 7                  Chanson Teems DANIEL

## 2016-05-09 ENCOUNTER — Encounter (HOSPITAL_COMMUNITY): Payer: Self-pay | Admitting: Neurological Surgery

## 2016-05-09 ENCOUNTER — Ambulatory Visit: Payer: Medicaid Other | Admitting: Cardiovascular Disease

## 2016-05-09 LAB — HIV ANTIBODY (ROUTINE TESTING W REFLEX): HIV Screen 4th Generation wRfx: NONREACTIVE

## 2016-05-09 MED ORDER — METHOCARBAMOL 750 MG PO TABS: 750.0000 mg | ORAL_TABLET | Freq: Three times a day (TID) | ORAL | 1 refills | Status: DC | PRN

## 2016-05-09 MED ORDER — HYDROCODONE-ACETAMINOPHEN 5-325 MG PO TABS: 1.0000 | ORAL_TABLET | ORAL | 0 refills | Status: DC | PRN
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
## 2016-05-09 NOTE — Progress Notes (Signed)
Pt seen and examined. No issues overnight.  EXAM: Temp:  [97.8 F (36.6 C)-98.5 F (36.9 C)] 98.3 F (36.8 C) (02/21 0425) Pulse Rate:  [86-107] 89 (02/21 0425) Resp:  [11-18] 18 (02/21 0425) BP: (109-127)/(65-80) 115/65 (02/21 0425) SpO2:  [95 %-99 %] 97 % (02/21 0425) Intake/Output      02/20 0701 - 02/21 0700 02/21 0701 - 02/22 0700   P.O. 1200    I.V. 1300    Total Intake 2500     Urine 260    Blood 50    Total Output 310     Net +2190          Urine Occurrence 3 x     Awake and alert Follows commands throughout Full strength  Stable Discharge
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
## 2016-05-09 NOTE — Discharge Summary (Signed)
Date of Admission: 05/08/2016  Date of Discharge: 05/09/16  PRE-OPERATIVE DIAGNOSIS:  Lumbosacral spondylosis with radiculopathy, spondylolisthesis of lumbosacral region, degenerative disc disease L3-4; use of BMP  POST-OPERATIVE DIAGNOSIS:  Same  PROCEDURE:  L3-4 transpsoas lateral interbody fusion with lateral plate fixation   Attending: Kevan Ny Ditty, MD  Hospital Course:  The patient was admitted for the above listed operation and had an uncomplicated post-operative course.  They were discharged in stable condition. Able to move all extremities. Pain is manageable. Tolerating po. Up ambulating. She reports she is ready to go home.   Follow up: 3 weeks  Allergies as of 05/09/2016      Reactions   Magnesium Sulfate Anaphylaxis   Sulfa Antibiotics Anaphylaxis   Codeine Itching      Medication List    STOP taking these medications   carisoprodol 250 MG tablet Commonly known as:  SOMA   cyclobenzaprine 10 MG tablet Commonly known as:  FLEXERIL     TAKE these medications   BC FAST PAIN RELIEF 650-195-33.3 MG Pack Generic drug:  Aspirin-Salicylamide-Caffeine Take 1 packet by mouth every 8 (eight) hours as needed (for pain relief.).   cholecalciferol 1000 units tablet Commonly known as:  VITAMIN D Take 1,000 Units by mouth daily.   furosemide 20 MG tablet Commonly known as:  LASIX Take 20 mg by mouth daily.   gabapentin 300 MG capsule Commonly known as:  NEURONTIN Take 300 mg by mouth 3 (three) times daily as needed for pain.   HYDROcodone-acetaminophen 5-325 MG tablet Commonly known as:  NORCO/VICODIN Take 1-2 tablets by mouth every 4 (four) hours as needed for moderate pain.   meloxicam 15 MG tablet Commonly known as:  MOBIC Take 15 mg by mouth daily as needed for pain.   methocarbamol 750 MG tablet Commonly known as:  ROBAXIN-750 Take 1 tablet (750 mg total) by mouth 3 (three) times daily as needed for muscle spasms.   ondansetron 4 MG  tablet Commonly known as:  ZOFRAN Take 4 mg by mouth 2 (two) times daily as needed for nausea or vomiting.   pantoprazole 20 MG tablet Commonly known as:  PROTONIX Take 20 mg by mouth daily as needed for heartburn or indigestion.   PROAIR HFA 108 (90 Base) MCG/ACT inhaler Generic drug:  albuterol Inhale 2 puffs into the lungs every 4 (four) hours as needed for wheezing or shortness of breath.   VIMPAT 100 MG Tabs Generic drug:  Lacosamide Take 1 tablet by mouth twice daily

## 2016-05-18 ENCOUNTER — Encounter: Payer: Self-pay | Admitting: Cardiovascular Disease

## 2016-05-18 ENCOUNTER — Ambulatory Visit (INDEPENDENT_AMBULATORY_CARE_PROVIDER_SITE_OTHER): Payer: Medicaid Other | Admitting: Cardiovascular Disease

## 2016-05-18 VITALS — BP 102/62 | HR 82 | Ht 64.0 in | Wt 200.0 lb

## 2016-05-18 DIAGNOSIS — I825Z2 Chronic embolism and thrombosis of unspecified deep veins of left distal lower extremity: Secondary | ICD-10-CM

## 2016-05-18 DIAGNOSIS — I10 Essential (primary) hypertension: Secondary | ICD-10-CM | POA: Diagnosis not present
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
## 2016-05-18 NOTE — Patient Instructions (Signed)
Medication Instructions: Your physician recommends that you continue on your current medications as directed. Please refer to the Current Medication list given to you today.   Follow-Up: Your physician recommends that you schedule a follow-up appointment as needed with Dr. Berry.  If you need a refill on your cardiac medications before your next appointment, please call your pharmacy.  

## 2016-05-18 NOTE — Assessment & Plan Note (Signed)
History of hypertension blood pressure measured 102/62. She is not on antihypertensive medications. Continue current meds at current dosing

## 2016-05-18 NOTE — Assessment & Plan Note (Signed)
History of left lower extremity DVT on Eliquis oral anticoagulation for at least 3 months with recent Doppler performed both in December and last month revealing resolution of that DVT. Her oral anticoagulation was discontinued.

## 2016-05-23 ENCOUNTER — Other Ambulatory Visit: Payer: Self-pay | Admitting: Cardiovascular Disease

## 2016-06-12 ENCOUNTER — Other Ambulatory Visit: Payer: Self-pay | Admitting: Neurology

## 2016-06-12 ENCOUNTER — Other Ambulatory Visit: Payer: Self-pay

## 2016-06-12 NOTE — Telephone Encounter (Signed)
No refills approved  - will discuss at 06/18/16 @ 9:30 am f/u appt.  Pt has not been seen since 07/08/15.

## 2016-06-18 ENCOUNTER — Ambulatory Visit (INDEPENDENT_AMBULATORY_CARE_PROVIDER_SITE_OTHER): Payer: Medicaid Other | Admitting: Neurology

## 2016-06-18 ENCOUNTER — Encounter: Payer: Self-pay | Admitting: Neurology

## 2016-06-18 VITALS — BP 150/80 | HR 78 | Temp 98.0°F | Resp 16

## 2016-06-18 DIAGNOSIS — R519 Headache, unspecified: Secondary | ICD-10-CM

## 2016-06-18 DIAGNOSIS — M542 Cervicalgia: Secondary | ICD-10-CM

## 2016-06-18 DIAGNOSIS — G40209 Localization-related (focal) (partial) symptomatic epilepsy and epileptic syndromes with complex partial seizures, not intractable, without status epilepticus: Secondary | ICD-10-CM | POA: Diagnosis not present

## 2016-06-18 DIAGNOSIS — R51 Headache: Secondary | ICD-10-CM | POA: Diagnosis not present

## 2016-06-18 MED ORDER — ZONISAMIDE 100 MG PO CAPS: ORAL_CAPSULE | ORAL | 6 refills | Status: DC
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
## 2016-06-28 ENCOUNTER — Telehealth: Payer: Self-pay | Admitting: Physical Therapy

## 2016-06-28 NOTE — Telephone Encounter (Signed)
06/28/16 spoke with patient re: PT eval, patient requested [facility] location, sent referral to [place]

## 2016-07-09 ENCOUNTER — Ambulatory Visit: Payer: Medicaid Other | Admitting: Physical Therapy

## 2016-07-09 ENCOUNTER — Ambulatory Visit: Payer: Medicaid Other | Attending: Neurology

## 2016-07-19 ENCOUNTER — Emergency Department (HOSPITAL_COMMUNITY): Payer: Medicaid Other

## 2016-07-19 ENCOUNTER — Encounter (HOSPITAL_COMMUNITY): Payer: Self-pay | Admitting: Emergency Medicine

## 2016-07-19 ENCOUNTER — Emergency Department (HOSPITAL_COMMUNITY)
Admission: EM | Admit: 2016-07-19 | Discharge: 2016-07-19 | Disposition: A | Discharge status: Home / Self Care | Payer: Medicaid Other | Attending: Emergency Medicine | Admitting: Emergency Medicine

## 2016-07-19 DIAGNOSIS — J449 Chronic obstructive pulmonary disease, unspecified: Secondary | ICD-10-CM | POA: Insufficient documentation

## 2016-07-19 DIAGNOSIS — I509 Heart failure, unspecified: Secondary | ICD-10-CM | POA: Diagnosis not present

## 2016-07-19 DIAGNOSIS — R102 Pelvic and perineal pain: Secondary | ICD-10-CM | POA: Diagnosis not present

## 2016-07-19 DIAGNOSIS — Z79899 Other long term (current) drug therapy: Secondary | ICD-10-CM | POA: Insufficient documentation

## 2016-07-19 DIAGNOSIS — I110 Hypertensive heart disease with heart failure: Secondary | ICD-10-CM | POA: Diagnosis not present

## 2016-07-19 DIAGNOSIS — R51 Headache: Secondary | ICD-10-CM | POA: Diagnosis not present

## 2016-07-19 DIAGNOSIS — R519 Headache, unspecified: Secondary | ICD-10-CM

## 2016-07-19 LAB — WET PREP, GENITAL
Clue Cells Wet Prep HPF POC: NONE SEEN
Sperm: NONE SEEN
Trich, Wet Prep: NONE SEEN
Yeast Wet Prep HPF POC: NONE SEEN

## 2016-07-19 LAB — URINALYSIS, ROUTINE W REFLEX MICROSCOPIC
Bilirubin Urine: NEGATIVE
Glucose, UA: NEGATIVE mg/dL
Hgb urine dipstick: NEGATIVE
Ketones, ur: NEGATIVE mg/dL
Leukocytes, UA: NEGATIVE
Nitrite: NEGATIVE
Protein, ur: NEGATIVE mg/dL
Specific Gravity, Urine: 1.013 (ref 1.005–1.030)
pH: 6 (ref 5.0–8.0)

## 2016-07-19 LAB — CBC WITH DIFFERENTIAL/PLATELET
Basophils Absolute: 0 10*3/uL (ref 0.0–0.1)
Basophils Relative: 0 %
Eosinophils Absolute: 0.1 10*3/uL (ref 0.0–0.7)
Eosinophils Relative: 1 %
HCT: 39.9 % (ref 36.0–46.0)
Hemoglobin: 12.9 g/dL (ref 12.0–15.0)
Lymphocytes Relative: 17 %
Lymphs Abs: 2 10*3/uL (ref 0.7–4.0)
MCH: 26 pg (ref 26.0–34.0)
MCHC: 32.3 g/dL (ref 30.0–36.0)
MCV: 80.4 fL (ref 78.0–100.0)
Monocytes Absolute: 0.5 10*3/uL (ref 0.1–1.0)
Monocytes Relative: 4 %
Neutro Abs: 8.9 10*3/uL — ABNORMAL HIGH (ref 1.7–7.7)
Neutrophils Relative %: 78 %
Platelets: 319 10*3/uL (ref 150–400)
RBC: 4.96 MIL/uL (ref 3.87–5.11)
RDW: 14 % (ref 11.5–15.5)
WBC: 11.5 10*3/uL — ABNORMAL HIGH (ref 4.0–10.5)

## 2016-07-19 LAB — COMPREHENSIVE METABOLIC PANEL
ALT: 21 U/L (ref 14–54)
AST: 20 U/L (ref 15–41)
Albumin: 4 g/dL (ref 3.5–5.0)
Alkaline Phosphatase: 72 U/L (ref 38–126)
Anion gap: 7 (ref 5–15)
BUN: 9 mg/dL (ref 6–20)
CO2: 25 mmol/L (ref 22–32)
Calcium: 9.4 mg/dL (ref 8.9–10.3)
Chloride: 105 mmol/L (ref 101–111)
Creatinine, Ser: 0.79 mg/dL (ref 0.44–1.00)
GFR calc Af Amer: >60 mL/min (ref >60)
GFR calc non Af Amer: >60 mL/min (ref >60)
Glucose, Bld: 139 mg/dL — ABNORMAL HIGH (ref 65–99)
Potassium: 3.6 mmol/L (ref 3.5–5.1)
Sodium: 137 mmol/L (ref 135–145)
Total Bilirubin: 1 mg/dL (ref 0.3–1.2)
Total Protein: 7.6 g/dL (ref 6.5–8.1)

## 2016-07-19 LAB — LIPASE, BLOOD: Lipase: 31 U/L (ref 11–51)

## 2016-07-19 LAB — I-STAT BETA HCG BLOOD, ED (MC, WL, AP ONLY): I-stat hCG, quantitative: <5 m[IU]/mL (ref <5)

## 2016-07-19 MED ORDER — MORPHINE SULFATE (PF) 4 MG/ML IV SOLN
4.0000 mg | Freq: Once | INTRAVENOUS | Status: AC
  Administered 2016-07-19: 4 mg via INTRAVENOUS
  Filled 2016-07-19: qty 1

## 2016-07-19 MED ORDER — METOCLOPRAMIDE HCL 5 MG/ML IJ SOLN
10.0000 mg | Freq: Once | INTRAMUSCULAR | Status: AC
  Administered 2016-07-19: 10 mg via INTRAVENOUS
  Filled 2016-07-19: qty 2

## 2016-07-19 NOTE — ED Provider Notes (Signed)
Care assumed from [facility], PA-C.  Pt presented w sudden onset, worsening headache x3hrs on ED arrival, as well as gradually worsening R-sided pelvic pain x2days. Initial workup reassuring per previous provider; normal neuro exam and head CT. Pelvic exam w R adnexal tenderness, transvaginal and pelvic U/S pending to r/o torsion vs cyst. Physical Exam  BP 133/83   Pulse 84   Temp 98.2 F (36.8 C) (Oral)   Resp 18   Ht 5\' 4"  (1.626 m)   Wt 86.2 kg   LMP 07/05/2016 (Exact Date)   SpO2 96%   BMI 32.61 kg/m   Physical Exam  Constitutional: She appears well-developed and well-nourished. No distress.  Pt resting comfortably  HENT:  Head: Normocephalic and atraumatic.  Eyes: Conjunctivae are normal.  Cardiovascular: Normal rate.   Pulmonary/Chest: Effort normal.  Psychiatric: She has a normal mood and affect. Her behavior is normal.  Nursing note and vitals reviewed.   ED Course  Procedures  MDM Pt w headache and RLQ/pelvic pain. Per initial provider's handoff and note, exam reassuring w normal neuro exam, no meningeal signs, no vaginal discharge or CMT. Wet prep w WBC, no clue cells, yeast or trich. Headache improved w reglan.  U/S without evidence of torsion or cysts. Pt resting comfortably. Discussed workup with patient and provided reassurance. Will discharge w referral to [facility] outpatient clinic as well as follow up w her neurologist, [place], as needed for headache. Pt hemodynamically stable, not in distress, safe for discharge.  Patient discussed with and seen by Dr. Randal Buba. Discussed results, findings, treatment and follow up. Patient advised of return precautions. Patient verbalized understanding and agreed with plan.         Martinique N Russo, PA-C 07/19/16 [id number]    April Palumbo, MD 07/19/16 0830
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
## 2016-07-19 NOTE — ED Notes (Signed)
Ultrasound at bedside

## 2016-07-19 NOTE — ED Triage Notes (Signed)
Pt brought in by EMS for c/o migraine headache that started 3 hours ago  Pt states she has had nausea and vomiting with the headache and states she is sensitive to light  Pt states her head has never hurt this bad before  Pt adds she has been having right flank pain for the past few days

## 2016-07-19 NOTE — ED Notes (Signed)
Pelvic supplies at bedside. 

## 2016-07-19 NOTE — Discharge Instructions (Signed)
Please read instructions below. You can take over-the-counter non-steroidal antiinflammatories as needed for pelvic pain. Follow up with your neurologist as needed for headaches. Schedule an appointment with [facility] as needed for persistent pelvic pain. Return to the ER for new or worsening symptoms.

## 2016-07-20 LAB — GC/CHLAMYDIA PROBE AMP (~~LOC~~) NOT AT ARMC
Chlamydia: NEGATIVE
Neisseria Gonorrhea: NEGATIVE

## 2016-08-18 NOTE — Addendum Note (Signed)
Addendum  created 08/18/16 0947 by Huck Ashworth, MD   Sign clinical note    

## 2016-08-22 ENCOUNTER — Other Ambulatory Visit: Payer: Self-pay | Admitting: Family Medicine

## 2016-08-22 DIAGNOSIS — N6489 Other specified disorders of breast: Secondary | ICD-10-CM

## 2016-08-27 ENCOUNTER — Ambulatory Visit
Admission: RE | Admit: 2016-08-27 | Discharge: 2016-08-27 | Disposition: A | Discharge status: Home / Self Care | Payer: Medicaid Other | Source: Ambulatory Visit | Attending: Family Medicine | Admitting: Family Medicine

## 2016-08-27 DIAGNOSIS — N6489 Other specified disorders of breast: Secondary | ICD-10-CM

## 2016-11-09 ENCOUNTER — Ambulatory Visit: Payer: Medicaid Other | Admitting: Neurology

## 2016-12-01 ENCOUNTER — Other Ambulatory Visit: Payer: Self-pay | Admitting: Neurology

## 2016-12-01 DIAGNOSIS — G40209 Localization-related (focal) (partial) symptomatic epilepsy and epileptic syndromes with complex partial seizures, not intractable, without status epilepticus: Secondary | ICD-10-CM

## 2016-12-01 DIAGNOSIS — R519 Headache, unspecified: Secondary | ICD-10-CM

## 2016-12-01 DIAGNOSIS — R51 Headache: Secondary | ICD-10-CM

## 2016-12-03 ENCOUNTER — Ambulatory Visit: Payer: Medicaid Other | Admitting: Neurology

## 2017-01-02 ENCOUNTER — Emergency Department (HOSPITAL_COMMUNITY): Payer: Medicaid Other

## 2017-01-02 ENCOUNTER — Encounter (HOSPITAL_COMMUNITY): Payer: Self-pay

## 2017-01-02 ENCOUNTER — Emergency Department (HOSPITAL_COMMUNITY)
Admission: EM | Admit: 2017-01-02 | Discharge: 2017-01-02 | Disposition: A | Discharge status: Home / Self Care | Payer: Medicaid Other | Attending: Emergency Medicine | Admitting: Emergency Medicine

## 2017-01-02 DIAGNOSIS — Z791 Long term (current) use of non-steroidal anti-inflammatories (NSAID): Secondary | ICD-10-CM | POA: Insufficient documentation

## 2017-01-02 DIAGNOSIS — I509 Heart failure, unspecified: Secondary | ICD-10-CM | POA: Insufficient documentation

## 2017-01-02 DIAGNOSIS — R0600 Dyspnea, unspecified: Secondary | ICD-10-CM | POA: Diagnosis not present

## 2017-01-02 DIAGNOSIS — R1013 Epigastric pain: Secondary | ICD-10-CM | POA: Diagnosis not present

## 2017-01-02 DIAGNOSIS — Z79899 Other long term (current) drug therapy: Secondary | ICD-10-CM | POA: Diagnosis not present

## 2017-01-02 DIAGNOSIS — R079 Chest pain, unspecified: Secondary | ICD-10-CM | POA: Diagnosis not present

## 2017-01-02 DIAGNOSIS — I110 Hypertensive heart disease with heart failure: Secondary | ICD-10-CM | POA: Insufficient documentation

## 2017-01-02 DIAGNOSIS — J449 Chronic obstructive pulmonary disease, unspecified: Secondary | ICD-10-CM | POA: Insufficient documentation

## 2017-01-02 LAB — CBC
HCT: 38.6 % (ref 36.0–46.0)
Hemoglobin: 12.3 g/dL (ref 12.0–15.0)
MCH: 25.7 pg — ABNORMAL LOW (ref 26.0–34.0)
MCHC: 31.9 g/dL (ref 30.0–36.0)
MCV: 80.6 fL (ref 78.0–100.0)
Platelets: 302 10*3/uL (ref 150–400)
RBC: 4.79 MIL/uL (ref 3.87–5.11)
RDW: 13.9 % (ref 11.5–15.5)
WBC: 13.1 10*3/uL — ABNORMAL HIGH (ref 4.0–10.5)

## 2017-01-02 LAB — BASIC METABOLIC PANEL
Anion gap: 8 (ref 5–15)
BUN: 5 mg/dL — ABNORMAL LOW (ref 6–20)
CO2: 24 mmol/L (ref 22–32)
Calcium: 8.8 mg/dL — ABNORMAL LOW (ref 8.9–10.3)
Chloride: 103 mmol/L (ref 101–111)
Creatinine, Ser: 0.76 mg/dL (ref 0.44–1.00)
GFR calc Af Amer: >60 mL/min (ref >60)
GFR calc non Af Amer: >60 mL/min (ref >60)
Glucose, Bld: 146 mg/dL — ABNORMAL HIGH (ref 65–99)
Potassium: 3.2 mmol/L — ABNORMAL LOW (ref 3.5–5.1)
Sodium: 135 mmol/L (ref 135–145)

## 2017-01-02 LAB — HEPATIC FUNCTION PANEL
ALT: 18 U/L (ref 14–54)
AST: 22 U/L (ref 15–41)
Albumin: 4 g/dL (ref 3.5–5.0)
Alkaline Phosphatase: 78 U/L (ref 38–126)
Bilirubin, Direct: 0.2 mg/dL (ref 0.1–0.5)
Indirect Bilirubin: 1.1 mg/dL — ABNORMAL HIGH (ref 0.3–0.9)
Total Bilirubin: 1.3 mg/dL — ABNORMAL HIGH (ref 0.3–1.2)
Total Protein: 7.6 g/dL (ref 6.5–8.1)

## 2017-01-02 LAB — I-STAT TROPONIN, ED: Troponin i, poc: 0 ng/mL (ref 0.00–0.08)

## 2017-01-02 LAB — LIPASE, BLOOD: Lipase: 24 U/L (ref 11–51)

## 2017-01-02 LAB — D-DIMER, QUANTITATIVE: D-Dimer, Quant: 0.31 ug/mL-FEU (ref 0.00–0.50)

## 2017-01-02 MED ORDER — FAMOTIDINE 20 MG PO TABS: 20.0000 mg | ORAL_TABLET | Freq: Two times a day (BID) | ORAL | 0 refills | Status: DC

## 2017-01-02 MED ORDER — PANTOPRAZOLE SODIUM 40 MG IV SOLR
40.0000 mg | Freq: Once | INTRAVENOUS | Status: AC
  Administered 2017-01-02: 40 mg via INTRAVENOUS
  Filled 2017-01-02: qty 40

## 2017-01-02 MED ORDER — FAMOTIDINE 20 MG PO TABS
20.0000 mg | ORAL_TABLET | Freq: Once | ORAL | Status: AC
  Administered 2017-01-02: 20 mg via ORAL
  Filled 2017-01-02: qty 1

## 2017-01-02 MED ORDER — OMEPRAZOLE 20 MG PO CPDR: 20.0000 mg | DELAYED_RELEASE_CAPSULE | Freq: Every day | ORAL | 0 refills | Status: DC

## 2017-01-02 NOTE — ED Triage Notes (Signed)
Pt arrived via EMS from home where pt called r/t having CP, and SOB. She states "It feels like I have a golf ball in my chest". She has recently been seen for dental pain and has an apt. To have a tooth pulled on Nov. 6th. Pt reported pain to [facility] and they called in oxycodone and penicillin. Pt reports taking medication today prior to symptoms starting. 2 Nitro and 324 ASA given PTA.

## 2017-01-02 NOTE — ED Notes (Signed)
Pt states she is feeling much better. Appears more comfortable.
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
## 2017-01-02 NOTE — ED Notes (Signed)
Pt verbalizes understanding of d/c instructions. Pt received prescriptions. Pt ambulatory at d/c with all belongings.  

## 2017-03-13 ENCOUNTER — Encounter: Payer: Self-pay | Admitting: Internal Medicine

## 2017-03-18 ENCOUNTER — Institutional Professional Consult (permissible substitution): Payer: Medicaid Other | Admitting: Internal Medicine

## 2017-05-20 ENCOUNTER — Other Ambulatory Visit (HOSPITAL_COMMUNITY): Payer: Self-pay | Admitting: Orthopedic Surgery

## 2017-05-20 ENCOUNTER — Ambulatory Visit (HOSPITAL_COMMUNITY): Payer: Medicaid Other

## 2017-05-20 DIAGNOSIS — R609 Edema, unspecified: Secondary | ICD-10-CM

## 2017-05-24 ENCOUNTER — Ambulatory Visit (HOSPITAL_COMMUNITY)
Admission: RE | Admit: 2017-05-24 | Discharge: 2017-05-24 | Disposition: A | Discharge status: Home / Self Care | Payer: Medicaid Other | Source: Ambulatory Visit | Attending: Orthopedic Surgery | Admitting: Orthopedic Surgery

## 2017-05-24 DIAGNOSIS — M79662 Pain in left lower leg: Secondary | ICD-10-CM | POA: Insufficient documentation

## 2017-05-24 DIAGNOSIS — R609 Edema, unspecified: Secondary | ICD-10-CM

## 2017-05-24 DIAGNOSIS — M7989 Other specified soft tissue disorders: Secondary | ICD-10-CM | POA: Insufficient documentation

## 2017-05-24 NOTE — Progress Notes (Signed)
Left lower extremity venous duplex has been completed. Negative for DVT. Results were given to [place] at Dr. Alvester Morin office.   05/24/17 10:16 AM Carlos Levering RVT

## 2017-06-24 ENCOUNTER — Institutional Professional Consult (permissible substitution): Payer: Medicaid Other | Admitting: Internal Medicine

## 2017-09-12 ENCOUNTER — Institutional Professional Consult (permissible substitution): Payer: Medicaid Other | Admitting: Internal Medicine

## 2017-11-04 ENCOUNTER — Ambulatory Visit: Payer: Medicaid Other | Admitting: Neurology

## 2017-11-22 ENCOUNTER — Encounter: Payer: Self-pay | Admitting: Internal Medicine

## 2017-11-22 ENCOUNTER — Ambulatory Visit (INDEPENDENT_AMBULATORY_CARE_PROVIDER_SITE_OTHER): Payer: Medicaid Other | Admitting: Internal Medicine

## 2017-11-22 VITALS — BP 110/80 | HR 71 | Ht 63.5 in | Wt 206.8 lb

## 2017-11-22 DIAGNOSIS — G40909 Epilepsy, unspecified, not intractable, without status epilepticus: Secondary | ICD-10-CM

## 2017-11-22 DIAGNOSIS — R5383 Other fatigue: Secondary | ICD-10-CM | POA: Diagnosis not present

## 2017-11-22 DIAGNOSIS — R0609 Other forms of dyspnea: Secondary | ICD-10-CM

## 2017-11-22 DIAGNOSIS — R0600 Dyspnea, unspecified: Secondary | ICD-10-CM

## 2017-11-22 DIAGNOSIS — G4733 Obstructive sleep apnea (adult) (pediatric): Secondary | ICD-10-CM

## 2017-11-22 NOTE — Patient Instructions (Signed)
Order- schedule split protocol NPSG with seizure montage    Dx OSA, seizure disorder  Order- schedule PFT   Dx dyspnea on exertion  Please call as needed

## 2017-11-22 NOTE — Assessment & Plan Note (Signed)
Not clear what she is describing.  Not clear that it is hypersomnia.  She describes jerking movements in her sleep that sometimes wake her and with history of seizure disorder, question if she is having sleep related seizures. Plan-schedule nocturnal polysomnogram with seizure montage.
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
## 2017-11-22 NOTE — Assessment & Plan Note (Addendum)
She does not distinguish well between exertional dyspnea and her persistent sense of fatigue, not relieved by increased sleep.  She was not anemic at last test.  Echocardiogram was normal. Plan-schedule PFT.  Oxygenation during sleep will be assessed during her sleep study.

## 2017-12-21 ENCOUNTER — Encounter (HOSPITAL_BASED_OUTPATIENT_CLINIC_OR_DEPARTMENT_OTHER): Payer: Medicaid Other

## 2018-02-27 ENCOUNTER — Ambulatory Visit: Payer: Medicaid Other | Admitting: Internal Medicine

## 2018-03-21 ENCOUNTER — Ambulatory Visit: Payer: Medicaid Other | Admitting: Neurology

## 2018-03-21 ENCOUNTER — Other Ambulatory Visit (INDEPENDENT_AMBULATORY_CARE_PROVIDER_SITE_OTHER): Payer: Medicaid Other

## 2018-03-21 ENCOUNTER — Other Ambulatory Visit: Payer: Self-pay

## 2018-03-21 VITALS — BP 124/68 | HR 89 | Ht 63.5 in | Wt 212.0 lb

## 2018-03-21 DIAGNOSIS — R51 Headache: Secondary | ICD-10-CM

## 2018-03-21 DIAGNOSIS — R519 Headache, unspecified: Secondary | ICD-10-CM

## 2018-03-21 DIAGNOSIS — G40209 Localization-related (focal) (partial) symptomatic epilepsy and epileptic syndromes with complex partial seizures, not intractable, without status epilepticus: Secondary | ICD-10-CM

## 2018-03-21 DIAGNOSIS — G4733 Obstructive sleep apnea (adult) (pediatric): Secondary | ICD-10-CM

## 2018-03-21 MED ORDER — ZONISAMIDE 100 MG PO CAPS: ORAL_CAPSULE | ORAL | 11 refills | Status: DC
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
## 2018-03-24 ENCOUNTER — Other Ambulatory Visit: Payer: Self-pay | Admitting: Physician Assistant

## 2018-03-24 ENCOUNTER — Encounter: Payer: Self-pay | Admitting: Neurology

## 2018-03-24 DIAGNOSIS — Z1231 Encounter for screening mammogram for malignant neoplasm of breast: Secondary | ICD-10-CM

## 2018-03-24 LAB — COMPREHENSIVE METABOLIC PANEL
AG Ratio: 1.3 (calc) (ref 1.0–2.5)
ALT: 15 U/L (ref 6–29)
AST: 14 U/L (ref 10–35)
Albumin: 4.3 g/dL (ref 3.6–5.1)
Alkaline phosphatase (APISO): 61 U/L (ref 33–115)
BUN: 12 mg/dL (ref 7–25)
CO2: 22 mmol/L (ref 20–32)
Calcium: 9.4 mg/dL (ref 8.6–10.2)
Chloride: 106 mmol/L (ref 98–110)
Creat: 0.77 mg/dL (ref 0.50–1.10)
Globulin: 3.3 g/dL (calc) (ref 1.9–3.7)
Glucose, Bld: 96 mg/dL (ref 65–99)
Potassium: 4 mmol/L (ref 3.5–5.3)
Sodium: 138 mmol/L (ref 135–146)
Total Bilirubin: 0.9 mg/dL (ref 0.2–1.2)
Total Protein: 7.6 g/dL (ref 6.1–8.1)

## 2018-03-24 LAB — CBC WITH DIFFERENTIAL/PLATELET
Absolute Monocytes: 792 cells/uL (ref 200–950)
Basophils Absolute: 50 cells/uL (ref 0–200)
Basophils Relative: 0.5 %
Eosinophils Absolute: 178 cells/uL (ref 15–500)
Eosinophils Relative: 1.8 %
HCT: 38.8 % (ref 35.0–45.0)
Hemoglobin: 12.8 g/dL (ref 11.7–15.5)
Lymphs Abs: 2356 cells/uL (ref 850–3900)
MCH: 25.9 pg — ABNORMAL LOW (ref 27.0–33.0)
MCHC: 33 g/dL (ref 32.0–36.0)
MCV: 78.5 fL — ABNORMAL LOW (ref 80.0–100.0)
MPV: 11.9 fL (ref 7.5–12.5)
Monocytes Relative: 8 %
Neutro Abs: 6524 cells/uL (ref 1500–7800)
Neutrophils Relative %: 65.9 %
Platelets: 326 10*3/uL (ref 140–400)
RBC: 4.94 10*6/uL (ref 3.80–5.10)
RDW: 13.8 % (ref 11.0–15.0)
Total Lymphocyte: 23.8 %
WBC: 9.9 10*3/uL (ref 3.8–10.8)

## 2018-03-24 LAB — ANTI-NUCLEAR AB-TITER (ANA TITER): ANA Titer 1: 1:40 {titer} — ABNORMAL HIGH

## 2018-03-24 LAB — SEDIMENTATION RATE: Sed Rate: 39 mm/h — ABNORMAL HIGH (ref 0–20)

## 2018-03-24 LAB — C-REACTIVE PROTEIN: CRP: 8.2 mg/L — ABNORMAL HIGH (ref <8.0)

## 2018-03-24 LAB — CK: Total CK: 67 U/L (ref 29–143)

## 2018-03-24 LAB — SJOGREN'S SYNDROME ANTIBODS(SSA + SSB)
SSA (Ro) (ENA) Antibody, IgG: <1 AI
SSB (La) (ENA) Antibody, IgG: <1 AI

## 2018-03-24 LAB — VITAMIN B12: Vitamin B-12: 695 pg/mL (ref 200–1100)

## 2018-03-24 LAB — ANA: Anti Nuclear Antibody(ANA): POSITIVE — AB

## 2018-03-24 LAB — TSH: TSH: 1.71 mIU/L

## 2018-03-28 ENCOUNTER — Telehealth: Payer: Self-pay

## 2018-03-28 NOTE — Telephone Encounter (Signed)
-----   Message from Cameron Sprang, MD sent at 03/27/2018 12:49 PM EST ----- Pls let her know the Sjogrens test was negative, bloodwork was overall unremarkable. There was minimal increase in inflammatory marker but not significant. Proceed with MRI as planned. Thanks

## 2018-03-28 NOTE — Telephone Encounter (Signed)
Spoke with pt relaying message below.  Pt states that MRI is scheduled 03/30/2018.  Advised that I will call with results once released to me.

## 2018-03-30 ENCOUNTER — Ambulatory Visit
Admission: RE | Admit: 2018-03-30 | Discharge: 2018-03-30 | Disposition: A | Discharge status: Home / Self Care | Payer: Medicaid Other | Source: Ambulatory Visit | Attending: Neurology | Admitting: Neurology

## 2018-03-30 ENCOUNTER — Other Ambulatory Visit: Payer: Self-pay | Admitting: Neurology

## 2018-03-30 DIAGNOSIS — R519 Headache, unspecified: Secondary | ICD-10-CM

## 2018-03-30 DIAGNOSIS — G4733 Obstructive sleep apnea (adult) (pediatric): Secondary | ICD-10-CM

## 2018-03-30 DIAGNOSIS — G40209 Localization-related (focal) (partial) symptomatic epilepsy and epileptic syndromes with complex partial seizures, not intractable, without status epilepticus: Secondary | ICD-10-CM

## 2018-03-30 DIAGNOSIS — R51 Headache: Secondary | ICD-10-CM

## 2018-03-30 MED ORDER — GADOBENATE DIMEGLUMINE 529 MG/ML IV SOLN
19.0000 mL | Freq: Once | INTRAVENOUS | Status: AC | PRN
  Administered 2018-03-30: 19 mL via INTRAVENOUS

## 2018-04-12 ENCOUNTER — Other Ambulatory Visit: Payer: Medicaid Other

## 2018-04-23 ENCOUNTER — Ambulatory Visit
Admission: RE | Admit: 2018-04-23 | Discharge: 2018-04-23 | Disposition: A | Discharge status: Home / Self Care | Payer: Medicaid Other | Source: Ambulatory Visit | Attending: Physician Assistant | Admitting: Physician Assistant

## 2018-04-23 ENCOUNTER — Encounter: Payer: Self-pay | Admitting: Radiology

## 2018-04-23 ENCOUNTER — Ambulatory Visit
Admission: RE | Admit: 2018-04-23 | Discharge: 2018-04-23 | Disposition: A | Discharge status: Home / Self Care | Payer: Medicaid Other | Source: Ambulatory Visit | Attending: Neurology | Admitting: Neurology

## 2018-04-23 DIAGNOSIS — Z1231 Encounter for screening mammogram for malignant neoplasm of breast: Secondary | ICD-10-CM

## 2018-04-23 DIAGNOSIS — R519 Headache, unspecified: Secondary | ICD-10-CM

## 2018-04-23 DIAGNOSIS — G40209 Localization-related (focal) (partial) symptomatic epilepsy and epileptic syndromes with complex partial seizures, not intractable, without status epilepticus: Secondary | ICD-10-CM

## 2018-04-23 DIAGNOSIS — G4733 Obstructive sleep apnea (adult) (pediatric): Secondary | ICD-10-CM

## 2018-04-23 DIAGNOSIS — R51 Headache: Secondary | ICD-10-CM

## 2018-04-24 ENCOUNTER — Other Ambulatory Visit: Payer: Self-pay

## 2018-04-24 DIAGNOSIS — G4733 Obstructive sleep apnea (adult) (pediatric): Secondary | ICD-10-CM

## 2018-04-24 DIAGNOSIS — M542 Cervicalgia: Secondary | ICD-10-CM

## 2018-05-01 ENCOUNTER — Other Ambulatory Visit: Payer: Self-pay

## 2018-05-15 ENCOUNTER — Ambulatory Visit: Payer: Medicaid Other | Admitting: Internal Medicine

## 2018-05-20 ENCOUNTER — Ambulatory Visit (INDEPENDENT_AMBULATORY_CARE_PROVIDER_SITE_OTHER): Payer: Self-pay | Admitting: Orthopaedic Surgery

## 2018-05-28 ENCOUNTER — Ambulatory Visit: Payer: Medicaid Other | Admitting: Internal Medicine

## 2018-06-03 ENCOUNTER — Ambulatory Visit (INDEPENDENT_AMBULATORY_CARE_PROVIDER_SITE_OTHER): Payer: Self-pay | Admitting: Orthopaedic Surgery

## 2018-06-20 ENCOUNTER — Encounter

## 2018-06-20 ENCOUNTER — Ambulatory Visit: Payer: Medicaid Other | Admitting: Neurology

## 2018-06-23 ENCOUNTER — Telehealth (INDEPENDENT_AMBULATORY_CARE_PROVIDER_SITE_OTHER): Payer: Self-pay | Admitting: Radiology

## 2018-06-23 NOTE — Telephone Encounter (Signed)
I left voicemail requesting return call to confirm patient appointment on 06/24/2018 at 1045 and to review COVID-19 screening questions.

## 2018-06-24 ENCOUNTER — Ambulatory Visit (INDEPENDENT_AMBULATORY_CARE_PROVIDER_SITE_OTHER): Payer: Self-pay | Admitting: Orthopaedic Surgery

## 2018-07-15 ENCOUNTER — Ambulatory Visit (INDEPENDENT_AMBULATORY_CARE_PROVIDER_SITE_OTHER): Payer: Self-pay | Admitting: Orthopaedic Surgery

## 2018-07-18 ENCOUNTER — Ambulatory Visit: Payer: Medicaid Other | Admitting: Internal Medicine

## 2018-08-26 ENCOUNTER — Ambulatory Visit: Payer: Self-pay | Admitting: Orthopaedic Surgery

## 2018-09-27 ENCOUNTER — Other Ambulatory Visit: Payer: Self-pay

## 2018-09-27 ENCOUNTER — Emergency Department (HOSPITAL_COMMUNITY)
Admission: EM | Admit: 2018-09-27 | Discharge: 2018-09-27 | Disposition: A | Discharge status: Home / Self Care | Payer: Medicaid Other | Attending: Emergency Medicine | Admitting: Emergency Medicine

## 2018-09-27 ENCOUNTER — Encounter (HOSPITAL_COMMUNITY): Payer: Self-pay

## 2018-09-27 DIAGNOSIS — R1030 Lower abdominal pain, unspecified: Secondary | ICD-10-CM | POA: Insufficient documentation

## 2018-09-27 DIAGNOSIS — R300 Dysuria: Secondary | ICD-10-CM | POA: Insufficient documentation

## 2018-09-27 DIAGNOSIS — Z5321 Procedure and treatment not carried out due to patient leaving prior to being seen by health care provider: Secondary | ICD-10-CM | POA: Insufficient documentation

## 2018-09-27 DIAGNOSIS — R112 Nausea with vomiting, unspecified: Secondary | ICD-10-CM | POA: Diagnosis not present

## 2018-09-27 DIAGNOSIS — R350 Frequency of micturition: Secondary | ICD-10-CM | POA: Insufficient documentation

## 2018-09-27 LAB — URINALYSIS, ROUTINE W REFLEX MICROSCOPIC
Bilirubin Urine: NEGATIVE
Glucose, UA: NEGATIVE mg/dL
Hgb urine dipstick: NEGATIVE
Ketones, ur: NEGATIVE mg/dL
Nitrite: NEGATIVE
Protein, ur: NEGATIVE mg/dL
Specific Gravity, Urine: 1.015 (ref 1.005–1.030)
pH: 7 (ref 5.0–8.0)

## 2018-09-27 LAB — PREGNANCY, URINE: Preg Test, Ur: NEGATIVE

## 2018-09-27 NOTE — ED Notes (Signed)
Urine culture sent to the lab. 

## 2018-09-27 NOTE — ED Triage Notes (Signed)
Pt arrived with complaints of painful urination and urinary frequency with lower abdominal pain. Pt stating this presents like her normal UTI symptoms. Pt states she has had nausea and vomiting.

## 2018-10-15 ENCOUNTER — Other Ambulatory Visit: Payer: Self-pay | Admitting: Internal Medicine

## 2018-10-25 ENCOUNTER — Other Ambulatory Visit (HOSPITAL_COMMUNITY): Payer: Medicaid Other | Attending: Internal Medicine

## 2018-10-28 ENCOUNTER — Telehealth: Payer: Self-pay | Admitting: Internal Medicine

## 2018-10-28 DIAGNOSIS — R05 Cough: Secondary | ICD-10-CM

## 2018-10-28 DIAGNOSIS — R059 Cough, unspecified: Secondary | ICD-10-CM

## 2018-10-28 NOTE — Telephone Encounter (Signed)
I initially called pt for routing coronavirus screen for her PFT and ROV appt scheduled tomorrow 10/28/2018. Pt states she had these appts cancelled because she started having symptoms of cough and fever. The appts had not yet been cancelled, so I went ahead and cancelled them for her. Pt states someone told her she would be able to get them rescheduled. I let her know I would get a message routed to Dr. Annamaria Boots to see what he thinks is best.   Dr. Annamaria Boots, please advise with your recommendations. Should we have pt test for COVID non pre-procedurally? Thank you.

## 2018-10-28 NOTE — Telephone Encounter (Signed)
Spoke with pt. She is aware of [facility]'s response. Order has been placed for COVID test. Pt has been made aware of the testing site locations. Nothing further was needed.

## 2018-10-28 NOTE — Telephone Encounter (Signed)
Might be good to get her tested now. Then routine reschedule PFT for whenever, with retest before that if current test is negative.

## 2018-10-29 ENCOUNTER — Ambulatory Visit: Payer: Medicaid Other | Admitting: Internal Medicine

## 2018-11-25 ENCOUNTER — Telehealth: Payer: Self-pay

## 2018-11-25 NOTE — Telephone Encounter (Signed)
9/8 LMTCB TO CONFIRM 9/18 VIRTUAL APPT OR RESCHEDULE FOR AN OV ON A DESIGNATED OFFICE DAY FOR DR. BERRY 

## 2018-12-05 ENCOUNTER — Ambulatory Visit: Payer: Medicaid Other | Admitting: Cardiovascular Disease

## 2018-12-29 ENCOUNTER — Other Ambulatory Visit: Payer: Self-pay | Admitting: Neurology

## 2018-12-29 DIAGNOSIS — G40209 Localization-related (focal) (partial) symptomatic epilepsy and epileptic syndromes with complex partial seizures, not intractable, without status epilepticus: Secondary | ICD-10-CM

## 2018-12-29 DIAGNOSIS — R519 Headache, unspecified: Secondary | ICD-10-CM

## 2019-04-07 ENCOUNTER — Other Ambulatory Visit: Payer: Self-pay | Admitting: Physician Assistant

## 2019-04-07 DIAGNOSIS — Z1231 Encounter for screening mammogram for malignant neoplasm of breast: Secondary | ICD-10-CM

## 2019-04-17 ENCOUNTER — Other Ambulatory Visit: Payer: Self-pay | Admitting: Neurology

## 2019-04-17 DIAGNOSIS — G40209 Localization-related (focal) (partial) symptomatic epilepsy and epileptic syndromes with complex partial seizures, not intractable, without status epilepticus: Secondary | ICD-10-CM

## 2019-04-17 DIAGNOSIS — R519 Headache, unspecified: Secondary | ICD-10-CM

## 2019-04-20 NOTE — Telephone Encounter (Signed)
Will need to schedule f/u, ok to give refills until her next f/u date. Thanks

## 2019-05-13 ENCOUNTER — Ambulatory Visit: Payer: Medicaid Other

## 2019-05-22 ENCOUNTER — Other Ambulatory Visit: Payer: Self-pay

## 2019-05-22 ENCOUNTER — Ambulatory Visit
Admission: RE | Admit: 2019-05-22 | Discharge: 2019-05-22 | Disposition: A | Discharge status: Home / Self Care | Payer: Medicaid Other | Source: Ambulatory Visit | Attending: Physician Assistant | Admitting: Physician Assistant

## 2019-05-22 DIAGNOSIS — Z1231 Encounter for screening mammogram for malignant neoplasm of breast: Secondary | ICD-10-CM

## 2019-07-06 ENCOUNTER — Other Ambulatory Visit: Payer: Self-pay

## 2019-07-06 ENCOUNTER — Emergency Department (HOSPITAL_BASED_OUTPATIENT_CLINIC_OR_DEPARTMENT_OTHER): Payer: Medicaid Other

## 2019-07-06 ENCOUNTER — Emergency Department (HOSPITAL_BASED_OUTPATIENT_CLINIC_OR_DEPARTMENT_OTHER)
Admission: EM | Admit: 2019-07-06 | Discharge: 2019-07-06 | Disposition: A | Discharge status: Home / Self Care | Payer: Medicaid Other | Attending: Emergency Medicine | Admitting: Emergency Medicine

## 2019-07-06 ENCOUNTER — Encounter (HOSPITAL_BASED_OUTPATIENT_CLINIC_OR_DEPARTMENT_OTHER): Payer: Self-pay | Admitting: Emergency Medicine

## 2019-07-06 DIAGNOSIS — J449 Chronic obstructive pulmonary disease, unspecified: Secondary | ICD-10-CM | POA: Insufficient documentation

## 2019-07-06 DIAGNOSIS — R0602 Shortness of breath: Secondary | ICD-10-CM | POA: Insufficient documentation

## 2019-07-06 DIAGNOSIS — M79605 Pain in left leg: Secondary | ICD-10-CM | POA: Diagnosis not present

## 2019-07-06 DIAGNOSIS — Z79899 Other long term (current) drug therapy: Secondary | ICD-10-CM | POA: Diagnosis not present

## 2019-07-06 DIAGNOSIS — I110 Hypertensive heart disease with heart failure: Secondary | ICD-10-CM | POA: Diagnosis not present

## 2019-07-06 DIAGNOSIS — I509 Heart failure, unspecified: Secondary | ICD-10-CM | POA: Insufficient documentation

## 2019-07-06 LAB — CBC WITH DIFFERENTIAL/PLATELET
Abs Immature Granulocytes: 0.05 10*3/uL (ref 0.00–0.07)
Basophils Absolute: 0.1 10*3/uL (ref 0.0–0.1)
Basophils Relative: 1 %
Eosinophils Absolute: 0.2 10*3/uL (ref 0.0–0.5)
Eosinophils Relative: 2 %
HCT: 40.5 % (ref 36.0–46.0)
Hemoglobin: 12.7 g/dL (ref 12.0–15.0)
Immature Granulocytes: 1 %
Lymphocytes Relative: 23 %
Lymphs Abs: 2.2 10*3/uL (ref 0.7–4.0)
MCH: 25.7 pg — ABNORMAL LOW (ref 26.0–34.0)
MCHC: 31.4 g/dL (ref 30.0–36.0)
MCV: 82 fL (ref 80.0–100.0)
Monocytes Absolute: 0.8 10*3/uL (ref 0.1–1.0)
Monocytes Relative: 8 %
Neutro Abs: 6.5 10*3/uL (ref 1.7–7.7)
Neutrophils Relative %: 65 %
Platelets: 332 10*3/uL (ref 150–400)
RBC: 4.94 MIL/uL (ref 3.87–5.11)
RDW: 13.6 % (ref 11.5–15.5)
WBC: 9.8 10*3/uL (ref 4.0–10.5)
nRBC: 0 % (ref 0.0–0.2)

## 2019-07-06 LAB — COMPREHENSIVE METABOLIC PANEL
ALT: 26 U/L (ref 0–44)
AST: 22 U/L (ref 15–41)
Albumin: 3.9 g/dL (ref 3.5–5.0)
Alkaline Phosphatase: 51 U/L (ref 38–126)
Anion gap: 10 (ref 5–15)
BUN: 10 mg/dL (ref 6–20)
CO2: 24 mmol/L (ref 22–32)
Calcium: 8.9 mg/dL (ref 8.9–10.3)
Chloride: 102 mmol/L (ref 98–111)
Creatinine, Ser: 0.89 mg/dL (ref 0.44–1.00)
GFR calc Af Amer: >60 mL/min (ref >60)
GFR calc non Af Amer: >60 mL/min (ref >60)
Glucose, Bld: 108 mg/dL — ABNORMAL HIGH (ref 70–99)
Potassium: 3.4 mmol/L — ABNORMAL LOW (ref 3.5–5.1)
Sodium: 136 mmol/L (ref 135–145)
Total Bilirubin: 1.3 mg/dL — ABNORMAL HIGH (ref 0.3–1.2)
Total Protein: 7.8 g/dL (ref 6.5–8.1)

## 2019-07-06 LAB — TROPONIN I (HIGH SENSITIVITY): Troponin I (High Sensitivity): <2 ng/L (ref <18)

## 2019-07-06 LAB — PREGNANCY, URINE: Preg Test, Ur: NEGATIVE

## 2019-07-06 MED ORDER — IOHEXOL 300 MG/ML  SOLN
100.0000 mL | Freq: Once | INTRAMUSCULAR | Status: AC | PRN
  Administered 2019-07-06: 100 mL via INTRAVENOUS

## 2019-07-06 NOTE — Discharge Instructions (Signed)
Your work-up today was overall reassuring. Follow-up with your primary care provider for any further management of this issue. Return to the emergency department for worsening shortness of breath, persistent chest pain, passing out, dizziness, or any other major concerns.
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
## 2019-07-06 NOTE — ED Notes (Signed)
Pt to u/s.  Aware of need for urine specimen.

## 2019-07-06 NOTE — ED Triage Notes (Signed)
L leg pain and swelling. Hx of DVT

## 2019-07-27 ENCOUNTER — Other Ambulatory Visit: Payer: Self-pay | Admitting: Neurology

## 2019-07-27 DIAGNOSIS — G40209 Localization-related (focal) (partial) symptomatic epilepsy and epileptic syndromes with complex partial seizures, not intractable, without status epilepticus: Secondary | ICD-10-CM

## 2019-07-27 DIAGNOSIS — R519 Headache, unspecified: Secondary | ICD-10-CM

## 2019-07-27 NOTE — Telephone Encounter (Signed)
She has not been seen in over a year and has no upcoming appointments. Pls have her make appt at front then we can send refills until her appt. Thanks

## 2019-07-28 NOTE — Telephone Encounter (Signed)
Pt called back to sch appt. She is sch for 09-30-19

## 2019-08-01 ENCOUNTER — Other Ambulatory Visit: Payer: Self-pay | Admitting: Neurology

## 2019-08-01 DIAGNOSIS — R519 Headache, unspecified: Secondary | ICD-10-CM

## 2019-08-01 DIAGNOSIS — G40209 Localization-related (focal) (partial) symptomatic epilepsy and epileptic syndromes with complex partial seizures, not intractable, without status epilepticus: Secondary | ICD-10-CM

## 2019-09-30 ENCOUNTER — Ambulatory Visit: Payer: Medicaid Other | Admitting: Neurology

## 2019-11-12 ENCOUNTER — Encounter: Payer: Self-pay | Admitting: Gastroenterology

## 2019-11-18 ENCOUNTER — Other Ambulatory Visit: Payer: Self-pay | Admitting: Neurology

## 2019-11-18 DIAGNOSIS — G40209 Localization-related (focal) (partial) symptomatic epilepsy and epileptic syndromes with complex partial seizures, not intractable, without status epilepticus: Secondary | ICD-10-CM

## 2019-11-18 DIAGNOSIS — R519 Headache, unspecified: Secondary | ICD-10-CM

## 2019-12-22 ENCOUNTER — Ambulatory Visit: Payer: Medicaid Other | Admitting: Gastroenterology

## 2020-01-19 ENCOUNTER — Ambulatory Visit: Payer: Medicaid Other | Admitting: Gastroenterology

## 2020-02-17 ENCOUNTER — Ambulatory Visit: Payer: Medicaid Other | Admitting: Gastroenterology

## 2020-03-16 ENCOUNTER — Other Ambulatory Visit: Payer: Self-pay | Admitting: Neurology

## 2020-03-16 DIAGNOSIS — R519 Headache, unspecified: Secondary | ICD-10-CM

## 2020-03-16 DIAGNOSIS — G40209 Localization-related (focal) (partial) symptomatic epilepsy and epileptic syndromes with complex partial seizures, not intractable, without status epilepticus: Secondary | ICD-10-CM

## 2020-04-21 ENCOUNTER — Other Ambulatory Visit: Payer: Self-pay | Admitting: Physician Assistant

## 2020-04-21 DIAGNOSIS — Z1231 Encounter for screening mammogram for malignant neoplasm of breast: Secondary | ICD-10-CM

## 2020-05-02 ENCOUNTER — Ambulatory Visit: Payer: Medicaid Other | Admitting: Neurology

## 2020-05-07 ENCOUNTER — Other Ambulatory Visit: Payer: Self-pay | Admitting: Neurology

## 2020-05-07 DIAGNOSIS — R519 Headache, unspecified: Secondary | ICD-10-CM

## 2020-05-07 DIAGNOSIS — G40209 Localization-related (focal) (partial) symptomatic epilepsy and epileptic syndromes with complex partial seizures, not intractable, without status epilepticus: Secondary | ICD-10-CM

## 2020-06-17 ENCOUNTER — Ambulatory Visit
Admission: RE | Admit: 2020-06-17 | Discharge: 2020-06-17 | Disposition: A | Discharge status: Home / Self Care | Payer: Medicaid Other | Source: Ambulatory Visit | Attending: Physician Assistant | Admitting: Physician Assistant

## 2020-06-17 ENCOUNTER — Other Ambulatory Visit: Payer: Self-pay

## 2020-06-17 DIAGNOSIS — Z1231 Encounter for screening mammogram for malignant neoplasm of breast: Secondary | ICD-10-CM
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
## 2020-08-17 ENCOUNTER — Encounter: Payer: Self-pay | Admitting: Internal Medicine

## 2020-08-17 ENCOUNTER — Other Ambulatory Visit: Payer: Self-pay

## 2020-08-17 ENCOUNTER — Ambulatory Visit (INDEPENDENT_AMBULATORY_CARE_PROVIDER_SITE_OTHER): Payer: Medicaid Other | Admitting: Internal Medicine

## 2020-08-17 VITALS — BP 134/88 | HR 84 | Temp 97.4°F | Ht 63.0 in | Wt 225.2 lb

## 2020-08-17 DIAGNOSIS — R400 Somnolence: Secondary | ICD-10-CM | POA: Insufficient documentation

## 2020-08-17 DIAGNOSIS — G4733 Obstructive sleep apnea (adult) (pediatric): Secondary | ICD-10-CM | POA: Insufficient documentation

## 2020-08-17 DIAGNOSIS — R0683 Snoring: Secondary | ICD-10-CM

## 2020-08-17 DIAGNOSIS — R0600 Dyspnea, unspecified: Secondary | ICD-10-CM | POA: Diagnosis not present

## 2020-08-17 DIAGNOSIS — H18609 Keratoconus, unspecified, unspecified eye: Secondary | ICD-10-CM | POA: Insufficient documentation

## 2020-08-17 DIAGNOSIS — R0609 Other forms of dyspnea: Secondary | ICD-10-CM

## 2020-08-17 DIAGNOSIS — R569 Unspecified convulsions: Secondary | ICD-10-CM | POA: Diagnosis not present

## 2020-08-17 DIAGNOSIS — H18603 Keratoconus, unspecified, bilateral: Secondary | ICD-10-CM

## 2020-08-17 NOTE — Assessment & Plan Note (Signed)
Never smoked, normal ECHO in past. No specific markers. Plan- PFT. After that consider retrying a maintenance inhaler.

## 2020-08-17 NOTE — Assessment & Plan Note (Signed)
Does not drive

## 2020-08-17 NOTE — Patient Instructions (Signed)
Order- Schedule PFT dx Dyspnea on exertion  Order- Schedule Split night sleep study at [facility] sleep center  Dx Snoring, Seizure Disorder  Please call us for results and recommendations about 2 weeks after your sleep test. If appropriate, we may be able to start treatment before we see you next.

## 2020-08-17 NOTE — Assessment & Plan Note (Signed)
She indicates night time sleep may be restless. No distinct parasomnias. She does have hx seizures, apparently controlled. Pharyngeal exam shows narrow airway. Possible OSA but need to watch also for limb movement and for seizure. Plan- Spit night sleep study.

## 2020-09-12 ENCOUNTER — Telehealth (INDEPENDENT_AMBULATORY_CARE_PROVIDER_SITE_OTHER): Payer: Medicaid Other | Admitting: Neurology

## 2020-09-12 ENCOUNTER — Encounter: Payer: Self-pay | Admitting: Neurology

## 2020-09-12 VITALS — Ht 63.25 in | Wt 215.0 lb

## 2020-09-12 DIAGNOSIS — G40209 Localization-related (focal) (partial) symptomatic epilepsy and epileptic syndromes with complex partial seizures, not intractable, without status epilepticus: Secondary | ICD-10-CM | POA: Diagnosis not present

## 2020-09-12 DIAGNOSIS — R519 Headache, unspecified: Secondary | ICD-10-CM | POA: Diagnosis not present

## 2020-09-12 MED ORDER — OXCARBAZEPINE 150 MG PO TABS: 150.0000 mg | ORAL_TABLET | Freq: Two times a day (BID) | ORAL | 6 refills | Status: DC

## 2020-09-12 MED ORDER — ZONISAMIDE 100 MG PO CAPS: ORAL_CAPSULE | ORAL | 11 refills | Status: DC
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
## 2020-09-12 NOTE — Progress Notes (Signed)
Virtual Visit via Video Note The purpose of this virtual visit is to provide medical care while limiting exposure to the novel coronavirus.    Consent was obtained for video visit:  Yes.   Answered questions that patient had about telehealth interaction:  Yes.   I discussed the limitations, risks, security and privacy concerns of performing an evaluation and management service by telemedicine. I also discussed with the patient that there may be a patient responsible charge related to this service. The patient expressed understanding and agreed to proceed.  Pt location: Home Physician Location: office Name of referring provider:  Trey Sailors, PA I connected with Suzanne Velazquez at patients initiation/request on 09/12/2020 at 10:00 AM EDT by video enabled telemedicine application and verified that I am speaking with the correct person using two identifiers. Pt MRN:  [id number] Pt DOB:  [birth year] Video Participants:  Suzanne Velazquez   History of Present Illness:  The patient had a virtual video visit on 09/12/2020. She was last seen over 2 years ago in the neurology clinic for seizures and chronic daily headaches. She is on Zonisamide 400mg  qhs for seizure and headache prophylaxis. Since her last visit, she denies any convulsions since 2017. She continues to report episodes of staring where she is not aware of what is going on, occurring 2-3 times a week. Sometimes she drools, one time she had urinary incontinence. She denies any olfactory/gustatory hallucinations, focal numbness/tingling/weakness, myoclonic jerks. Headaches are the same, occurring almost daily. She has cut down on Advil intake, only taking when pain is unbearable (not on a daily basis any longer). Headaches used to be worse in the morning, now they occur at random times. She is scheduled for a sleep study in August 2022. She lives with her father, grandson. She does not drive. Mood is fine. She denies any dizziness,  vision changes, no falls.    History on Initial Assessment 09/08/2013:  This is a 50 yo RH woman with a history of hypertension, seizures, and headaches.  1. Seizures: She started having seizures in 2006. Seizures would start would a bad headache that would get intense quickly with sharp pain over the left temporal region, occasional nausea, then she has to lie down and could not function. She reports that she would then have a seizure, where she starts feeling a little shaky like she would faint, sees white spots in her vision, smells an alcohol ("like rubbing alcohol") smell, then has no recollection of events. Family has told her she would become unresponsive, her right arm or leg would extend up, followed by violent shaking for 30-45 seconds. She feels diffusely sore after, no focal weakness. She hit her head one time, otherwise no other injuries/tongue bite/incontinence. She has not had any shaking episodes since June 2014, however she continues to have smaller episodes where she has the feeling like it's going to happen with the alcohol smell, but does not progress. She would usually sit and calm herself down, lasting 1-2 minutes. In the past she was told by her daughter that she would have staring and unresponsive episodes, however since this daughter moved to [place], she is unaware of any further episodes, her other 2 children have not mentioned anything similar.  Prior AEDs: Topamax, Depakote, Lamictal, ?Tegretol with no effect. Once she started Keppra, seizure frequency became much less.   Epilepsy Risk Factors: Her maternal cousin had seizures since childhood, her son has autism and intractable epilepsy. She collapsed in the 4th grade  but has had no further similar symptoms until 2006. She graduated high school in regular classes. There is no history of febrile convulsions, CNS infections, significant traumatic brain injuries, or neurosurgical procedures.   2. Headaches: She recalls having  headaches for the past 20 years. She reports "there is not a single day that I am not in pain." She has been to the ER a couple of times a month for a period of 10 years due to intense pain and has had multiple head imaging studies. Headaches are usually a throbbing aggravating 4/10 pain over the left temporal region, waxing and waning in intensity up to a 10/10 with pain in the base of the neck. She used to take 6-8 tablets of Advil daily, but recently had reduced this to 4 tabs/day in addition to Flexeril. There is some nausea and photophobia with the headaches.   Prior headache preventative medications: propranolol, amitriptyline, nortriptyline, ?Effexor for the headaches in the past with minimal effect.  Prior headache rescue medications: Tramadol, Imitrex and Relpax made her headaches worse.   Diagnostic Data:   I personally reviewed head CT done 08/22/13 which was unremarkable. She had a head CT in 2004 with note of fluid collection in the left medial temporal lobe. 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 Imaging studies unavailable for review. Her most recent MRI brain with and without contrast in 2010 reported as normal, on my review, the cystic structure on the left medial temporal lobe is noted with no abnormal enhancement.   MRI brain with and without contrast done 03/2018 was normal.  Her routine EEG in 09/2013 was normal 24-hour EEG in 10/2014 was normal, no epileptiform discharges seen, however typical events were not captured.    Current Outpatient Medications on File Prior to Visit  Medication Sig Dispense Refill   albuterol (VENTOLIN HFA) 108 (90 Base) MCG/ACT inhaler Inhale 2 puffs into the lungs every 4 (four) hours as needed for wheezing or shortness of breath.     carisoprodol (SOMA) 250 MG tablet Take 250 mg by mouth at bedtime as needed (pain).   5   cholecalciferol  (VITAMIN D) 1000 units tablet Take 1,000 Units by mouth daily.     furosemide (LASIX) 20 MG tablet Take 20 mg by mouth daily.  0   zonisamide (ZONEGRAN) 100 MG capsule Take 4 capsules at night 120 capsule 4   No current facility-administered medications on file prior to visit.     Observations/Objective:   Vitals:   09/12/20 1006  Weight: 215 lb (97.5 kg)  Height: 5' 3.25" (1.607 m)   GEN:  The patient appears stated age and is in NAD.  Neurological examination: Patient is awake, alert. No aphasia or dysarthria. Intact fluency and comprehension. Cranial nerves: Extraocular movements intact with no nystagmus. No facial asymmetry. Motor: moves all extremities symmetrically, at least anti-gravity x 4. No incoordination on finger to nose testing. 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 Most recent brain MRI with and without contrast in 2020 was normal. Her prior routine and 24-hour EEGs in 2016 were normal. She denies any convulsions since 2017 but continues to report episodes of staring with loss  of awareness 2-3 times a week. She continues to have daily headaches. We discussed adding on oxcarbazepine 150mg  BID, side effects discussed. Continue Zonisamide 400mg  qhs. She is aware of minimizing headache rescue medications to 2-3 a week to avoid rebound headaches, proceed with sleep study as scheduled. She does not drive. Follow-up in 3-4 months, call for any changes.    Follow Up Instructions:   -I discussed the assessment and treatment plan with the patient. The patient was provided an opportunity to ask questions and all were answered. The patient agreed with the plan and demonstrated an understanding of the instructions.   The patient was advised to call back or  seek an in-person evaluation if the symptoms worsen or if the condition fails to improve as anticipated.    Cameron Sprang, MD   CC: Raelyn Number, PA

## 2020-10-21 ENCOUNTER — Ambulatory Visit (HOSPITAL_BASED_OUTPATIENT_CLINIC_OR_DEPARTMENT_OTHER): Payer: Medicaid Other | Attending: Internal Medicine | Admitting: Internal Medicine

## 2020-10-21 ENCOUNTER — Other Ambulatory Visit: Payer: Self-pay

## 2020-10-21 VITALS — Ht 63.0 in | Wt 220.0 lb

## 2020-10-21 DIAGNOSIS — R0683 Snoring: Secondary | ICD-10-CM | POA: Diagnosis not present

## 2020-10-21 DIAGNOSIS — G4733 Obstructive sleep apnea (adult) (pediatric): Secondary | ICD-10-CM | POA: Insufficient documentation

## 2020-10-21 DIAGNOSIS — R569 Unspecified convulsions: Secondary | ICD-10-CM | POA: Diagnosis not present

## 2020-10-30 DIAGNOSIS — R0683 Snoring: Secondary | ICD-10-CM | POA: Diagnosis not present

## 2020-10-30 NOTE — Procedures (Signed)
    Patient Name: Suzanne Velazquez, Suzanne Velazquez Date: 10/21/2020 Gender: Female D.O.B: Oct 27, 1970 Age (years): 71 Referring Provider: Baird Lyons MD, ABSM Height (inches): 63 Interpreting Physician: Baird Lyons MD, ABSM Weight (lbs): 220 RPSGT: Baxter Flattery BMI: 39 MRN: [id number] Neck Size: 16.00  CLINICAL INFORMATION Sleep Study Type: NPSG Indication for sleep study: Fatigue, Obesity, Snoring, Witnesses Apnea / Gasping During Sleep Epworth Sleepiness Score: 3  SLEEP STUDY TECHNIQUE As per the AASM Manual for the Scoring of Sleep and Associated Events v2.3 (April 2016) with a hypopnea requiring 4% desaturations.  The channels recorded and monitored were frontal, central and occipital EEG, electrooculogram (EOG), submentalis EMG (chin), nasal and oral airflow, thoracic and abdominal wall motion, anterior tibialis EMG, snore microphone, electrocardiogram, and pulse oximetry.  MEDICATIONS Medications self-administered by patient taken the night of the study : none reported  SLEEP ARCHITECTURE The study was initiated at 10:04:34 PM and ended at 4:42:42 AM.  Sleep onset time was 37.2 minutes and the sleep efficiency was 84.0%%. The total sleep time was 334.5 minutes.  Stage REM latency was 110.5 minutes.  The patient spent 2.7%% of the night in stage N1 sleep, 81.8%% in stage N2 sleep, 0.0%% in stage N3 and 15.6% in REM.  Alpha intrusion was absent.  Supine sleep was 100.00%.  RESPIRATORY PARAMETERS The overall apnea/hypopnea index (AHI) was 13.5 per hour. There were 9 total apneas, including 9 obstructive, 0 central and 0 mixed apneas. There were 66 hypopneas and 20 RERAs.  The AHI during Stage REM sleep was 53.1 per hour.  AHI while supine was 13.5 per hour.  The mean oxygen saturation was 93.0%. The minimum SpO2 during sleep was 76.0%.  loud snoring was noted during this study.  CARDIAC DATA The 2 lead EKG demonstrated sinus rhythm. The mean heart rate was 63.7 beats  per minute. Other EKG findings include: None.  LEG MOVEMENT DATA The total PLMS were 0 with a resulting PLMS index of 0.0. Associated arousal with leg movement index was 0.0 .  IMPRESSIONS - Mild obstructive sleep apnea occurred during this study (AHI = 13.5/h). - Insuffficient early events to meet protocol requirements for split CPAP titration. - Moderate oxygen desaturation was noted during this study (Min O2 = 76.0%). - The patient snored with loud snoring volume. - No cardiac abnormalities were noted during this study. - Clinically significant periodic limb movements did not occur during sleep. No significant associated arousals. - DIAGNOSIS - Obstructive Sleep Apnea (G47.33)  RECOMMENDATIONS - Suggest CPAP titration sleep study or autopap. Other options would be based on clinical judgment. - Be careful with alcohol, sedatives and other CNS depressants that may worsen sleep apnea and disrupt normal sleep architecture. - Sleep hygiene should be reviewed to assess factors that may improve sleep quality. - Weight management and regular exercise should be initiated or continued if appropriate.  [Electronically signed] 10/30/2020 11:52 AM  Baird Lyons MD, ABSM Diplomate, American Board of Sleep Medicine   NPI: [id number]                         [facility]  ELECTRONICALLY SIGNED ON:  10/30/2020, 11:49 AM [place] PH: [phone]   FX: [phone] [place]

## 2020-11-14 ENCOUNTER — Other Ambulatory Visit (HOSPITAL_COMMUNITY): Payer: Medicaid Other

## 2020-11-16 NOTE — Progress Notes (Signed)
HPI F never smoker followed for OSA, complicated by CHF, headache, COPD, DVT, HBP, epilepsy (complex partial seizures), rheumatoid arthritis, Legally Blind (keratoconus)/ Disabled,  NPSG 10/21/20- AHI 13.5/ hr, desaturation to 76%, body weight 220 lbs PFT 11/17/20-minimal obstruction, no resp to BD, mild restriction, relative increased DLCO ===========================================================   08/17/20- 8 yoF never smoker  Seen in 2019 with concern of EDS,  possible OSA. Never had sleep study. Returns now to re-establish. Was also concerned about dyspnea. Medical problem list includes CHF, headache, COPD, DVT, HBP, epilepsy (complex partial seizures), rheumatoid arthritis, Legally Blind (keratoconus)/ Disabled,  - Proair hfa, Soma,  Epworth score-8 Body weight today- Covid - 2 Phizer -----Patient is feeling good overall, states she still needs to do PFT. Sleeping good.  ACT score 14 She always feels tired- "could sleep 12 hours, get up and go right back to sleep". Night time sleep not very restful- wakes a lot after sleep onset. Hx seizure disorder, but not aware that she is having sleep seizures. Takes Soma for sleep at night. ENT surgery +tonsils out.  I don't get hx cataplexy. Can't breath well unless head propped up on extra pillows. Her PCP tells her xrays show "obstruction" without explanation. Proair used 1-2x/ daily, does help. A "COPD inhaler" was not helpful so she quit. Little cough or wheeze. Reports dyspnea on exertion since around 2007, with hx DVT in 2017.No routine cough or wheeze. Normal ECHO 2017.  11/17/20- 49 yoF never smoker followed for OSA, Daytime Somnolence, Dyspnea on exertion, complicated by CHF, headache, COPD, DVT, HBP, epilepsy (complex , partial seizures), rheumatoid arthritis, Legally Blind (keratoconus)/ Disabled,  -Ventolin hfa NPSG 10/21/20- AHI 13.5/ hr, desaturation to 76%, body weight 220 lbs Body weight today-221 lbs Covid vax-   2 Phizer      ??start  CPAP PFT 11/17/20- minimal obstruction, no resp to BD, borderline/ mild restriction, relative increased DLCO We reviewed sleep study, discussed options, and will start CPAP 5-15. Hope this helps her c/o tiredness. Not clear why minimal abnl PFTs. Remembers isolated choking incident years ago, With no resp to BD and no wheeze or cough we will get CXR , maybe CT for architecture, and consider BD trial later.   ROS-see HPI   + = positive Constitutional:    weight loss, night sweats, fevers, chills, +fatigue, lassitude. HEENT:    +headaches, +difficulty swallowing, tooth/dental problems, sore throat,       sneezing, itching, ear ache, nasal congestion, post nasal drip, snoring CV:    chest pain, orthopnea, PND, +swelling in lower extremities, anasarca,                                  dizziness, palpitations Resp:   +shortness of breath with exertion or at rest.                productive cough,   +-productive cough, coughing up of blood.              change in color of mucus.  wheezing.   Skin:    rash or lesions. GI:  +heartburn, indigestion, abdominal pain, nausea, vomiting, diarrhea,                 change in bowel habits, loss of appetite GU: dysuria, change in color of urine, no urgency or frequency.   flank pain. MS:   joint pain, stiffness, decreased range of motion, back pain. Neuro-  nothing unusual Psych:  change in mood or affect.  depression or anxiety.   memory loss.  OBJ- Physical Exam General- Alert, Oriented, Affect-appropriate, Distress- none acute, + overweight Skin- rash-none, lesions- none, excoriation- none Lymphadenopathy- none Head- atraumatic            Eyes- + vision impaired            Ears- Hearing, canals-normal            Nose- Clear, no-Septal dev, mucus, polyps, erosion, perforation             Throat- Mallampati II , mucosa clear , drainage- none, tonsils-absent,  + dentures Neck- flexible , trachea midline, no stridor , thyroid nl, carotid no bruit Chest  - symmetrical excursion , unlabored           Heart/CV- RRR , no murmur , no gallop  , no rub, nl s1 s2                           - JVD- none , edema- none, stasis changes- none, varices- none           Lung- clear to P&A, wheeze- none, cough- none , dullness-none, rub- none           Chest wall-  Abd-  Br/ Gen/ Rectal- Not done, not indicated Extrem- cyanosis- none, clubbing, none, atrophy- none, strength- nl Neuro- grossly intact to observation

## 2020-11-17 ENCOUNTER — Other Ambulatory Visit: Payer: Self-pay

## 2020-11-17 ENCOUNTER — Encounter: Payer: Self-pay | Admitting: Internal Medicine

## 2020-11-17 ENCOUNTER — Ambulatory Visit (INDEPENDENT_AMBULATORY_CARE_PROVIDER_SITE_OTHER): Payer: Medicaid Other | Admitting: Internal Medicine

## 2020-11-17 ENCOUNTER — Ambulatory Visit (INDEPENDENT_AMBULATORY_CARE_PROVIDER_SITE_OTHER): Payer: Medicaid Other

## 2020-11-17 VITALS — BP 132/78 | HR 68 | Temp 98.3°F | Ht 64.0 in | Wt 221.4 lb

## 2020-11-17 DIAGNOSIS — R0600 Dyspnea, unspecified: Secondary | ICD-10-CM

## 2020-11-17 DIAGNOSIS — G4733 Obstructive sleep apnea (adult) (pediatric): Secondary | ICD-10-CM

## 2020-11-17 DIAGNOSIS — R0609 Other forms of dyspnea: Secondary | ICD-10-CM

## 2020-11-17 LAB — PULMONARY FUNCTION TEST
DL/VA % pred: 128 %
DL/VA: 5.57 ml/min/mmHg/L
DLCO cor % pred: 103 %
DLCO cor: 21.86 ml/min/mmHg
DLCO unc % pred: 103 %
DLCO unc: 21.86 ml/min/mmHg
FEF 25-75 Post: 2.53 L/sec
FEF 25-75 Pre: 1.91 L/sec
FEF2575-%Change-Post: 32 %
FEF2575-%Pred-Post: 90 %
FEF2575-%Pred-Pre: 68 %
FEV1-%Change-Post: 6 %
FEV1-%Pred-Post: 75 %
FEV1-%Pred-Pre: 71 %
FEV1-Post: 2.13 L
FEV1-Pre: 2.01 L
FEV1FVC-%Change-Post: 4 %
FEV1FVC-%Pred-Pre: 100 %
FEV6-%Change-Post: 1 %
FEV6-%Pred-Post: 73 %
FEV6-%Pred-Pre: 71 %
FEV6-Post: 2.53 L
FEV6-Pre: 2.48 L
FEV6FVC-%Change-Post: 0 %
FEV6FVC-%Pred-Post: 102 %
FEV6FVC-%Pred-Pre: 102 %
FVC-%Change-Post: 1 %
FVC-%Pred-Post: 71 %
FVC-%Pred-Pre: 69 %
FVC-Post: 2.53 L
FVC-Pre: 2.48 L
Post FEV1/FVC ratio: 84 %
Post FEV6/FVC ratio: 100 %
Pre FEV1/FVC ratio: 81 %
Pre FEV6/FVC Ratio: 100 %
RV % pred: 88 %
RV: 1.56 L
TLC % pred: 80 %
TLC: 4.09 L

## 2020-11-17 MED ORDER — TRELEGY ELLIPTA 100-62.5-25 MCG/INH IN AEPB: 1.0000 | INHALATION_SPRAY | Freq: Every day | RESPIRATORY_TRACT | 0 refills | Status: DC

## 2020-11-17 NOTE — Patient Instructions (Signed)
Order- CXR    dx Dyspnea on exertion  Order- sample Trelegy 100    inhale 1 puff then rinse mouth, once daily. See if this helps your breathing  Order-  new DME , new CPAP auto 5-15, mask of choice, humidifier, supplies, AirView/ card  Please call if we can help

## 2020-11-17 NOTE — Progress Notes (Signed)
PFT done today. 

## 2020-11-22 ENCOUNTER — Encounter: Payer: Self-pay | Admitting: *Deleted

## 2020-12-04 ENCOUNTER — Encounter: Payer: Self-pay | Admitting: Internal Medicine

## 2020-12-04 NOTE — Assessment & Plan Note (Signed)
Hopefully, treating OSA will address herr daytime tiredness. Plan-CPAP auto 5-15

## 2020-12-04 NOTE — Assessment & Plan Note (Signed)
PFT doesn't suggest significant abnormality. Eyesight limits aerobic exercise. Plan- PFT, CXR , sample trial Trelegy 100

## 2020-12-14 ENCOUNTER — Telehealth (INDEPENDENT_AMBULATORY_CARE_PROVIDER_SITE_OTHER): Payer: Medicaid Other | Admitting: Neurology

## 2020-12-14 ENCOUNTER — Encounter: Payer: Self-pay | Admitting: Neurology

## 2020-12-14 ENCOUNTER — Other Ambulatory Visit: Payer: Self-pay

## 2020-12-14 VITALS — Ht 64.0 in

## 2020-12-14 DIAGNOSIS — G40209 Localization-related (focal) (partial) symptomatic epilepsy and epileptic syndromes with complex partial seizures, not intractable, without status epilepticus: Secondary | ICD-10-CM

## 2020-12-14 DIAGNOSIS — R519 Headache, unspecified: Secondary | ICD-10-CM

## 2020-12-14 NOTE — Progress Notes (Signed)
Virtual Visit via Video Note The purpose of this virtual visit is to provide medical care while limiting exposure to the novel coronavirus.    Consent was obtained for video visit:  Yes.   Answered questions that patient had about telehealth interaction:  Yes.   I discussed the limitations, risks, security and privacy concerns of performing an evaluation and management service by telemedicine. I also discussed with the patient that there may be a patient responsible charge related to this service. The patient expressed understanding and agreed to proceed.  Pt location: Home Physician Location: office Name of referring provider:  Trey Sailors, PA I connected with Suzanne Child Swofford at patients initiation/request on 12/14/2020 at  2:00 PM EDT by video enabled telemedicine application and verified that I am speaking with the correct person using two identifiers. Pt MRN:  [id number] Pt DOB:  1970/09/14 Video Participants:  Suzanne Velazquez   History of Present Illness:  I had the pleasure of seeing Suzanne Velazquez in follow-up in the neurology clinic on 12/14/2020.  The patient was last seen 3 months ago for seizures and chronic daily headaches. On her last visit, she denied any convulsions since 2017 but continued to report episodes of staring where she was unaware of her surroundings, occurring 2-3 times a week. Oxcarbazepine was added to Zonisamide 400mg  qhs. She states that when she takes it, it gives her headaches and affects her sleep where she cannot go to sleep at night, "almost like insomnia." She lives with her father and grandson, they have not mentioned any staring spells recently. She denies any episodes of urinary incontinence. She continues to have near-daily headaches and continues to minimize Advil intake. She had a sleep study showing mild sleep apnea, currently awaiting her CPAP machine. She had COVID in December and sense of taste and smell have not come back.     History on Initial Assessment 09/08/2013:  This is a 50 yo RH woman with a history of hypertension, seizures, and headaches.  1. Seizures: She started having seizures in 2006. Seizures would start would a bad headache that would get intense quickly with sharp pain over the left temporal region, occasional nausea, then she has to lie down and could not function. She reports that she would then have a seizure, where she starts feeling a little shaky like she would faint, sees white spots in her vision, smells an alcohol ("like rubbing alcohol") smell, then has no recollection of events. Family has told her she would become unresponsive, her right arm or leg would extend up, followed by violent shaking for 30-45 seconds. She feels diffusely sore after, no focal weakness. She hit her head one time, otherwise no other injuries/tongue bite/incontinence. She has not had any shaking episodes since June 2014, however she continues to have smaller episodes where she has the feeling like it's going to happen with the alcohol smell, but does not progress. She would usually sit and calm herself down, lasting 1-2 minutes. In the past she was told by her daughter that she would have staring and unresponsive episodes, however since this daughter moved to [place], she is unaware of any further episodes, her other 2 children have not mentioned anything similar.  Prior AEDs: Topamax, Depakote, Lamictal, ?Tegretol with no effect. Once she started Keppra, seizure frequency became much less.   Epilepsy Risk Factors: Her maternal cousin had seizures since childhood, her son has autism and intractable epilepsy. She collapsed in the 4th grade but has  had no further similar symptoms until 2006. She graduated high school in regular classes. There is no history of febrile convulsions, CNS infections, significant traumatic brain injuries, or neurosurgical procedures.   2. Headaches: She recalls having headaches for the past 20  years. She reports "there is not a single day that I am not in pain." She has been to the ER a couple of times a month for a period of 10 years due to intense pain and has had multiple head imaging studies. Headaches are usually a throbbing aggravating 4/10 pain over the left temporal region, waxing and waning in intensity up to a 10/10 with pain in the base of the neck. She used to take 6-8 tablets of Advil daily, but recently had reduced this to 4 tabs/day in addition to Flexeril. There is some nausea and photophobia with the headaches.   Prior headache preventative medications: propranolol, amitriptyline, nortriptyline, ?Effexor for the headaches in the past with minimal effect.  Prior headache rescue medications: Tramadol, Imitrex and Relpax made her headaches worse.   Diagnostic Data:   I personally reviewed head CT done 08/22/13 which was unremarkable. She had a head CT in 2004 with note of fluid collection in the left medial temporal lobe. 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 Imaging studies unavailable for review. Her most recent MRI brain with and without contrast in 2010 reported as normal, on my review, the cystic structure on the left medial temporal lobe is noted with no abnormal enhancement.   MRI brain with and without contrast done 03/2018 was normal.  Her routine EEG in 09/2013 was normal 24-hour EEG in 10/2014 was normal, no epileptiform discharges seen, however typical events were not captured.      Current Outpatient Medications on File Prior to Visit  Medication Sig Dispense Refill   albuterol (VENTOLIN HFA) 108 (90 Base) MCG/ACT inhaler Inhale 2 puffs into the lungs every 4 (four) hours as needed for wheezing or shortness of breath.     carisoprodol (SOMA) 250 MG tablet Take 250 mg by mouth at bedtime as needed (pain).   5   cholecalciferol (VITAMIN D) 1000 units tablet  Take 1,000 Units by mouth daily.     Fluticasone-Umeclidin-Vilant (TRELEGY ELLIPTA) 100-62.5-25 MCG/INH AEPB Inhale 1 puff into the lungs daily. 28 each 0   OXcarbazepine (TRILEPTAL) 150 MG tablet Take 1 tablet (150 mg total) by mouth 2 (two) times daily. 60 tablet 6   zonisamide (ZONEGRAN) 100 MG capsule Take 4 capsules every night 120 capsule 11   No current facility-administered medications on file prior to visit.     Observations/Objective:   Vitals:   12/14/20 1043  Height: 5\' 4"  (1.626 m)   GEN:  The patient appears stated age and is in NAD.  Neurological examination: Patient is awake, alert. No aphasia or dysarthria. Intact fluency and comprehension.  Cranial nerves: Extraocular movements intact with no nystagmus. No facial asymmetry. Motor: moves all extremities symmetrically, at least anti-gravity x 4.    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 Most recent brain MRI with and without contrast in 2020 was normal. Her prior routine and 24-hour EEGs in 2016 were normal. No convulsions since 2017. She was reporting  episodes of staring with loss of awareness 2-3 times a week, these seemed to have quieted down with addition of oxcarbazepine, however she is reporting headaches (on top of regular headaches) and sleep difficulties on low dose. We discussed stopping oxcarbazepine, she will contact our office in 2 weeks, if no change in symptoms, these are not due to oxcarbazepine. If improved, we will plan to start a different seizure medications. Continue Zonisamide 400mg  qhs. Proceed with CPAP initiation, hopefully this will help with headaches as well. She knows to minimize rescue medications to 2-3 a week to avoid rebound headaches. She does not drive. Follow-up in 4 months, call for any changes.    Follow  Up Instructions:   -I discussed the assessment and treatment plan with the patient. The patient was provided an opportunity to ask questions and all were answered. The patient agreed with the plan and demonstrated an understanding of the instructions.   The patient was advised to call back or seek an in-person evaluation if the symptoms worsen or if the condition fails to improve as anticipated.     Cameron Sprang, MD

## 2020-12-14 NOTE — Patient Instructions (Signed)
Stop the oxcarbazepine. Please contact our office for an update in 2 weeks, once oxcarbazepine side effects have improved, we will start a different seizure medication.  2. Continue Zonisamide 400mg  every night  3. Proceed with CPAP  4. Follow-up in 4 months, call for any changes.    Seizure Precautions: 1. If medication has been prescribed for you to prevent seizures, take it exactly as directed.  Do not stop taking the medicine without talking to your doctor first, even if you have not had a seizure in a long time.   2. Avoid activities in which a seizure would cause danger to yourself or to others.  Don't operate dangerous machinery, swim alone, or climb in high or dangerous places, such as on ladders, roofs, or girders.  Do not drive unless your doctor says you may.  3. If you have any warning that you may have a seizure, lay down in a safe place where you can't hurt yourself.    4.  No driving for 6 months from last seizure, as per [facility].   Please refer to the following link on the [facility] website for more information: http://[email or web address]/[email or web address]/driving/drivingu.cfm   5.  Maintain good sleep hygiene.  6.  Contact your doctor if you have any problems that may be related to the medicine you are taking.  7.  Call 911 and bring the patient back to the ED if:        A.  The seizure lasts longer than 5 minutes.       B.  The patient doesn't awaken shortly after the seizure  C.  The patient has new problems such as difficulty seeing, speaking or moving  D.  The patient was injured during the seizure  E.  The patient has a temperature over 102 F (39C)  F.  The patient vomited and now is having trouble breathing

## 2021-03-21 ENCOUNTER — Ambulatory Visit: Payer: Medicaid Other | Admitting: Internal Medicine

## 2021-03-23 ENCOUNTER — Ambulatory Visit: Payer: Medicaid Other | Admitting: Neurology

## 2021-03-24 ENCOUNTER — Other Ambulatory Visit: Payer: Self-pay | Admitting: Neurology

## 2021-03-30 ENCOUNTER — Ambulatory Visit: Payer: Medicaid Other | Admitting: Neurology

## 2021-04-21 ENCOUNTER — Encounter: Payer: Self-pay | Admitting: Internal Medicine

## 2021-04-21 ENCOUNTER — Other Ambulatory Visit: Payer: Self-pay | Admitting: Neurology

## 2021-04-25 ENCOUNTER — Other Ambulatory Visit: Payer: Self-pay | Admitting: Neurology

## 2021-08-04 ENCOUNTER — Other Ambulatory Visit: Payer: Self-pay | Admitting: Physician Assistant

## 2021-08-04 DIAGNOSIS — Z1231 Encounter for screening mammogram for malignant neoplasm of breast: Secondary | ICD-10-CM

## 2021-08-17 ENCOUNTER — Ambulatory Visit: Payer: Medicaid Other | Admitting: Podiatry

## 2021-08-17 ENCOUNTER — Encounter: Payer: Self-pay | Admitting: Podiatry

## 2021-08-17 DIAGNOSIS — B351 Tinea unguium: Secondary | ICD-10-CM | POA: Diagnosis not present

## 2021-08-17 DIAGNOSIS — L600 Ingrowing nail: Secondary | ICD-10-CM | POA: Diagnosis not present

## 2021-08-17 NOTE — Progress Notes (Signed)
Subjective:   Patient ID: Suzanne Velazquez, female   DOB: 51 y.o.   MRN: [id number]   HPI Patient presents on referral with thick yellow brittle nailbeds 1-5 both feet and also problems with her skin of both feet stating that she does get itching component.  Is just started oral Lamisil in the last 2 weeks and has multiple questions.  Patient does not smoke is not active   Review of Systems  All other systems reviewed and are negative.      Objective:  Physical Exam Vitals and nursing note reviewed.  Constitutional:      Appearance: She is well-developed.  Pulmonary:     Effort: Pulmonary effort is normal.  Musculoskeletal:        General: Normal range of motion.  Skin:    General: Skin is warm.  Neurological:     Mental Status: She is alert.    Neurovascular status found to be intact muscle strength was found to be adequate range of motion is relatively normal.  Patient is noted to have severely thickened yellow brittle nailbeds 1-5 both feet dystrophic with a family member who is also had several nails removed and family history of condition along with dermatitis to the skin in moccasin like appearance of the skin structure.  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  Nail removal may be a possibility in future but at this point were going to go ahead and were going to utilize the oral antifungal therapy but I do not think she should be on for longer than 3 months and I explained that to her.  Spent a great deal time going over fungal infections and treatment options available signed this

## 2021-08-18 ENCOUNTER — Ambulatory Visit: Payer: Medicaid Other | Admitting: Neurology

## 2021-08-22 ENCOUNTER — Ambulatory Visit: Payer: Medicaid Other

## 2021-08-29 ENCOUNTER — Other Ambulatory Visit: Payer: Self-pay | Admitting: Neurology

## 2021-09-08 ENCOUNTER — Ambulatory Visit: Payer: Medicaid Other | Admitting: Obstetrics

## 2021-09-21 ENCOUNTER — Ambulatory Visit: Payer: Medicaid Other

## 2021-09-21 ENCOUNTER — Other Ambulatory Visit: Payer: Self-pay | Admitting: Neurology

## 2021-09-21 DIAGNOSIS — R519 Headache, unspecified: Secondary | ICD-10-CM

## 2021-09-21 DIAGNOSIS — G40209 Localization-related (focal) (partial) symptomatic epilepsy and epileptic syndromes with complex partial seizures, not intractable, without status epilepticus: Secondary | ICD-10-CM

## 2021-09-29 ENCOUNTER — Ambulatory Visit: Payer: Medicaid Other | Admitting: Neurology

## 2021-10-18 ENCOUNTER — Ambulatory Visit: Payer: Medicaid Other | Admitting: Student

## 2021-11-21 ENCOUNTER — Ambulatory Visit: Payer: Medicaid Other

## 2021-12-06 ENCOUNTER — Ambulatory Visit: Payer: Medicaid Other | Admitting: Student

## 2021-12-19 ENCOUNTER — Ambulatory Visit: Payer: Medicaid Other

## 2022-01-17 ENCOUNTER — Ambulatory Visit
Admission: RE | Admit: 2022-01-17 | Discharge: 2022-01-17 | Disposition: A | Discharge status: Home / Self Care | Payer: Medicaid Other | Source: Ambulatory Visit | Attending: Physician Assistant | Admitting: Physician Assistant

## 2022-01-17 DIAGNOSIS — Z1231 Encounter for screening mammogram for malignant neoplasm of breast: Secondary | ICD-10-CM

## 2022-03-07 ENCOUNTER — Ambulatory Visit: Payer: Medicaid Other | Admitting: Obstetrics

## 2022-03-22 ENCOUNTER — Emergency Department (HOSPITAL_COMMUNITY)
Admission: EM | Admit: 2022-03-22 | Discharge: 2022-03-23 | Disposition: A | Discharge status: Home / Self Care | Payer: Medicaid Other | Attending: Emergency Medicine | Admitting: Emergency Medicine

## 2022-03-22 ENCOUNTER — Other Ambulatory Visit: Payer: Self-pay

## 2022-03-22 DIAGNOSIS — J45909 Unspecified asthma, uncomplicated: Secondary | ICD-10-CM | POA: Insufficient documentation

## 2022-03-22 DIAGNOSIS — R519 Headache, unspecified: Secondary | ICD-10-CM

## 2022-03-22 DIAGNOSIS — J449 Chronic obstructive pulmonary disease, unspecified: Secondary | ICD-10-CM | POA: Insufficient documentation

## 2022-03-22 DIAGNOSIS — R030 Elevated blood-pressure reading, without diagnosis of hypertension: Secondary | ICD-10-CM

## 2022-03-22 DIAGNOSIS — I110 Hypertensive heart disease with heart failure: Secondary | ICD-10-CM | POA: Diagnosis not present

## 2022-03-22 DIAGNOSIS — I509 Heart failure, unspecified: Secondary | ICD-10-CM | POA: Insufficient documentation

## 2022-03-22 NOTE — ED Triage Notes (Signed)
Pt bib gem pt /o high bp seen dr on 12/29 for high BP she received medication today . Symptomatic with dizziness , neck pain and headache and nauseous enroute with ems. '4mg'$  zofran given with ems. Hr- 95 100% ra, bp 920 systolic. 20 g in  LAC.

## 2022-03-23 ENCOUNTER — Emergency Department (HOSPITAL_COMMUNITY): Payer: Medicaid Other

## 2022-03-23 LAB — CBC WITH DIFFERENTIAL/PLATELET
Abs Immature Granulocytes: 0.05 10*3/uL (ref 0.00–0.07)
Basophils Absolute: 0.1 10*3/uL (ref 0.0–0.1)
Basophils Relative: 1 %
Eosinophils Absolute: 0.1 10*3/uL (ref 0.0–0.5)
Eosinophils Relative: 1 %
HCT: 39.5 % (ref 36.0–46.0)
Hemoglobin: 12.4 g/dL (ref 12.0–15.0)
Immature Granulocytes: 1 %
Lymphocytes Relative: 20 %
Lymphs Abs: 1.6 10*3/uL (ref 0.7–4.0)
MCH: 26 pg (ref 26.0–34.0)
MCHC: 31.4 g/dL (ref 30.0–36.0)
MCV: 82.8 fL (ref 80.0–100.0)
Monocytes Absolute: 0.5 10*3/uL (ref 0.1–1.0)
Monocytes Relative: 6 %
Neutro Abs: 6 10*3/uL (ref 1.7–7.7)
Neutrophils Relative %: 71 %
Platelets: 288 10*3/uL (ref 150–400)
RBC: 4.77 MIL/uL (ref 3.87–5.11)
RDW: 12.9 % (ref 11.5–15.5)
WBC: 8.4 10*3/uL (ref 4.0–10.5)
nRBC: 0 % (ref 0.0–0.2)

## 2022-03-23 LAB — BASIC METABOLIC PANEL
Anion gap: 9 (ref 5–15)
BUN: 9 mg/dL (ref 6–20)
CO2: 24 mmol/L (ref 22–32)
Calcium: 9.2 mg/dL (ref 8.9–10.3)
Chloride: 107 mmol/L (ref 98–111)
Creatinine, Ser: 0.82 mg/dL (ref 0.44–1.00)
GFR, Estimated: >60 mL/min (ref >60)
Glucose, Bld: 135 mg/dL — ABNORMAL HIGH (ref 70–99)
Potassium: 3.7 mmol/L (ref 3.5–5.1)
Sodium: 140 mmol/L (ref 135–145)

## 2022-03-23 MED ORDER — DIPHENHYDRAMINE HCL 50 MG/ML IJ SOLN
INTRAMUSCULAR | Status: AC
  Filled 2022-03-23: qty 1

## 2022-03-23 MED ORDER — METOCLOPRAMIDE HCL 5 MG/ML IJ SOLN
10.0000 mg | Freq: Once | INTRAMUSCULAR | Status: AC
  Administered 2022-03-23: 10 mg via INTRAVENOUS
  Filled 2022-03-23: qty 2

## 2022-03-23 MED ORDER — KETOROLAC TROMETHAMINE 30 MG/ML IJ SOLN
15.0000 mg | Freq: Once | INTRAMUSCULAR | Status: AC
  Administered 2022-03-23: 15 mg via INTRAVENOUS
  Filled 2022-03-23: qty 1

## 2022-03-23 MED ORDER — DIPHENHYDRAMINE HCL 25 MG PO CAPS
25.0000 mg | ORAL_CAPSULE | Freq: Once | ORAL | Status: AC
  Administered 2022-03-23: 25 mg via ORAL
  Filled 2022-03-23: qty 1

## 2022-03-23 MED ORDER — IOHEXOL 350 MG/ML SOLN
75.0000 mL | Freq: Once | INTRAVENOUS | Status: AC | PRN
  Administered 2022-03-23: 75 mL via INTRAVENOUS

## 2022-03-23 MED ORDER — CARVEDILOL 3.125 MG PO TABS
6.2500 mg | ORAL_TABLET | Freq: Two times a day (BID) | ORAL | Status: DC
  Filled 2022-03-23: qty 2

## 2022-03-23 MED ORDER — SODIUM CHLORIDE 0.9 % IV BOLUS
500.0000 mL | Freq: Once | INTRAVENOUS | Status: AC
  Administered 2022-03-23: 500 mL via INTRAVENOUS

## 2022-03-23 NOTE — ED Provider Notes (Signed)
[facility] EMERGENCY DEPARTMENT Provider Note   CSN: [id number] Arrival date & time: 03/22/22  2335     History  Chief Complaint  Patient presents with   Neck Pain    [place] Suzanne Velazquez is a 52 y.o. female.  HPI   Patient with medical history including hypertension, CHF, asthma, seizures, COPD, presents with  complaints of headaches.  Patient states that headache has been going on for last couple months, she states the headaches are mainly in the back of her head, she states this happens because she has chronic neck pain and when she has has neck pain she gets a headache, also causes her blood pressure to increase, she states that when she try to go to bed last night she started to have neck pain and then she developed a headache and then she started to feel slightly dizzy as if she was on a boat she checked her blood pressure was significant elevated.  She states that she was currently on losartan but stopped taking because it made her give a headache, her primary switch her to carvedilol but she is yet to start taking it, so the patient has been not taking blood pressure medication since the 29th.  She is not endorsing any change in vision, paresthesias or weakness the upper lower extremities, no nausea no vomiting no recent head trauma she is not on anticoag's.  Home Medications Prior to Admission medications   Medication Sig Start Date End Date Taking? Authorizing Provider  albuterol (VENTOLIN HFA) 108 (90 Base) MCG/ACT inhaler Inhale 2 puffs into the lungs every 4 (four) hours as needed for wheezing or shortness of breath.    [provider]  carisoprodol (SOMA) 250 MG tablet Take 250 mg by mouth at bedtime as needed (pain).  05/17/16   [provider]  cholecalciferol (VITAMIN D) 1000 units tablet Take 1,000 Units by mouth daily.    [provider]  DULoxetine (CYMBALTA) 30 MG capsule 1 cap(s) orally 2 times a day for 30 day(s)     [provider]  Fluticasone-Umeclidin-Vilant (TRELEGY ELLIPTA) 100-62.5-25 MCG/INH AEPB Inhale 1 puff into the lungs daily. 11/17/20   Deneise Lever, MD  furosemide (LASIX) 20 MG tablet 1 tab(s) orally once a day for 30 day(s) 11/13/17   [provider]  gabapentin (NEURONTIN) 100 MG capsule 1 cap(s) orally at bedtime for 30 day(s)    [provider]  levETIRAcetam (KEPPRA) 500 MG tablet 1 tab(s) orally 2 times a day for 30 days    [provider]  meloxicam (MOBIC) 7.5 MG tablet 1 tab(s) orally once a day for 30 day(s)    [provider]  valsartan-hydrochlorothiazide (DIOVAN-HCT) 80-12.5 MG tablet 1 tab(s) orally once a day for 30 day(s) 07/18/21 08/17/21  [provider]  zonisamide (ZONEGRAN) 100 MG capsule Take 4 capsules every night 09/21/21   Cameron Sprang, MD      Allergies    Magnesium sulfate, Sulfa antibiotics, and Codeine    Review of Systems   Review of Systems  Constitutional:  Negative for chills and fever.  Respiratory:  Negative for shortness of breath.   Cardiovascular:  Negative for chest pain.  Gastrointestinal:  Negative for abdominal pain.  Musculoskeletal:  Positive for neck pain.  Neurological:  Positive for headaches.    Physical Exam Updated Vital Signs BP (!) 199/110   Pulse 98   Temp 98.4 F (36.9 C) (Oral)   Resp 11  Ht '5\' 4"'$  (1.626 m)   Wt 97.5 kg   SpO2 95%   BMI 36.90 kg/m  Physical Exam Vitals and nursing note reviewed.  Constitutional:      General: She is not in acute distress.    Appearance: She is not ill-appearing.  HENT:     Head: Normocephalic and atraumatic.     Nose: No congestion.  Eyes:     Extraocular Movements: Extraocular movements intact.     Conjunctiva/sclera: Conjunctivae normal.     Pupils: Pupils are equal, round, and reactive to light.  Cardiovascular:     Rate and Rhythm: Normal rate and regular rhythm.     Pulses: Normal pulses.     Heart sounds: No murmur  heard.    No friction rub. No gallop.  Pulmonary:     Effort: No respiratory distress.     Breath sounds: No wheezing, rhonchi or rales.  Abdominal:     Palpations: Abdomen is soft.     Tenderness: There is no abdominal tenderness. There is no right CVA tenderness or left CVA tenderness.  Musculoskeletal:     Comments: Spine was palpated she had noted cervical spine tenderness without crepitus deformities noted, pain was focalized and reproducible, there is no overlying skin changes.  She is moving her upper and lower extremities without difficulty.  Skin:    General: Skin is warm and dry.  Neurological:     Mental Status: She is alert.     GCS: GCS eye subscore is 4. GCS verbal subscore is 5. GCS motor subscore is 6.     Cranial Nerves: Cranial nerves 2-12 are intact. No facial asymmetry.     Sensory: Sensation is intact.     Motor: No weakness.     Coordination: Romberg sign negative. Finger-Nose-Finger Test normal.     Gait: Gait is intact.     Comments: Cranial nerves II through XII grossly intact no difficulty with word finding, following two-step commands there is no unilateral weakness present.  Psychiatric:        Mood and Affect: Mood normal.     ED Results / Procedures / Treatments   Labs (all labs ordered are listed, but only abnormal results are displayed) Labs Reviewed  BASIC METABOLIC PANEL - Abnormal; Notable for the following components:      Result Value   Glucose, Bld 135 (*)    All other components within normal limits  CBC WITH DIFFERENTIAL/PLATELET    EKG EKG Interpretation  Date/Time:  Friday March 23 2022 04:52:47 EST Ventricular Rate:  80 PR Interval:  178 QRS Duration: 82 QT Interval:  400 QTC Calculation: 461 R Axis:   73 Text Interpretation: Normal sinus rhythm Normal ECG When compared with ECG of 06-Jul-2019 15:59, PREVIOUS ECG IS PRESENT Confirmed by Merrily Pew [phone]) on 03/23/2022 5:19:45 AM  Radiology CT ANGIO HEAD NECK W WO  CM  Result Date: 03/23/2022 CLINICAL DATA:  Sudden, severe headache EXAM: CT ANGIOGRAPHY HEAD AND NECK TECHNIQUE: Multidetector CT imaging of the head and neck was performed using the standard protocol during bolus administration of intravenous contrast. Multiplanar CT image reconstructions and MIPs were obtained to evaluate the vascular anatomy. Carotid stenosis measurements (when applicable) are obtained utilizing NASCET criteria, using the distal internal carotid diameter as the denominator. RADIATION DOSE REDUCTION: This exam was performed according to the departmental dose-optimization program which includes automated exposure control, adjustment of the mA and/or kV according to patient size and/or use of iterative reconstruction technique.  CONTRAST:  44m OMNIPAQUE IOHEXOL 350 MG/ML SOLN COMPARISON:  Brain MRI 03/30/2018 FINDINGS: CT HEAD FINDINGS Brain: No evidence of acute infarction, hemorrhage, hydrocephalus, extra-axial collection or mass lesion/mass effect. Vascular: See below Skull: Normal. Negative for fracture or focal lesion. Sinuses/Orbits: No acute finding. Review of the MIP images confirms the above findings CTA NECK FINDINGS Aortic arch: Normal Right carotid system: Vessels are smoothly contoured and diffusely patent Left carotid system: Vessels are smoothly contoured and diffusely patent Vertebral arteries: Suboptimal visualization. No vessel irregularity or stenosis noted. Skeleton: No acute finding Other neck: No acute finding Upper chest: Clear apical lungs Review of the MIP images confirms the above findings CTA HEAD FINDINGS Anterior circulation: No proximal occlusion, flow limiting stenosis, or detectable beading. No evidence of vascular malformation or aneurysm. Posterior circulation: No proximal occlusion, flow limiting stenosis, or detectable beading. No evidence of vascular malformation or aneurysm. Venous sinuses: Diffusely patent Anatomic variants: None significant Review of the MIP  images confirms the above findings IMPRESSION: 1. No acute finding or explanation for headache. 2. Suboptimal CTA due to IV failure. Electronically Signed   By: JJorje GuildM.D.   On: 03/23/2022 04:15    Procedures Procedures    Medications Ordered in ED Medications  carvedilol (COREG) tablet 6.25 mg (has no administration in time range)  metoCLOPramide (REGLAN) injection 10 mg (10 mg Intravenous Given 03/23/22 0233)  sodium chloride 0.9 % bolus 500 mL (500 mLs Intravenous New Bag/Given 03/23/22 0233)  diphenhydrAMINE (BENADRYL) capsule 25 mg (25 mg Oral Given 03/23/22 0253)  iohexol (OMNIPAQUE) 350 MG/ML injection 75 mL (75 mLs Intravenous Contrast Given 03/23/22 0404)  ketorolac (TORADOL) 30 MG/ML injection 15 mg (15 mg Intravenous Given 03/23/22 0452)    ED Course/ Medical Decision Making/ A&P                           Medical Decision Making Amount and/or Complexity of Data Reviewed Labs: ordered. Radiology: ordered.  Risk Prescription drug management.   This patient presents to the ED for concern of headache, neck pain, this involves an extensive number of treatment options, and is a complaint that carries with it a high risk of complications and morbidity.  The differential diagnosis includes meningitis, dissection, CVA,    Additional history obtained:  Additional history obtained from N/A External records from outside source obtained and reviewed including pcp notes   Co morbidities that complicate the patient evaluation  Hypertension  Social Determinants of Health:  N/A    Lab Tests:  I Ordered, and personally interpreted labs.  The pertinent results include: CBC is unremarkable, BMP shows glucose of 135,   Imaging Studies ordered:  I ordered imaging studies including CTA head and neck I independently visualized and interpreted imaging which showed negative acute findings I agree with the radiologist interpretation   Cardiac Monitoring:  The patient was  maintained on a cardiac monitor.  I personally viewed and interpreted the cardiac monitored which showed an underlying rhythm of: N/A   Medicines ordered and prescription drug management:  I ordered medication including migraine cocktail I have reviewed the patients home medicines and have made adjustments as needed  Critical Interventions:  N/A   Reevaluation:  Presents with headache and neck pain there is no meningeal sign present my exam, I suspect likely the neck pain is chronic but due to the fact that she is having headache and has elevated BP I cannot exclude the possibility of a  carotid dissection, will obtain CTA head and neck for further evaluation also prior with a migraine cocktail  Reassessed resting comfortably headache has resolved, she is given discharge at this time    Consultations Obtained:  N/a    Test Considered:  N/a    Rule out low suspicion for internal head bleed and or mass as CT imaging is negative for acute findings.  Low suspicion for CVA she has no focal deficit present my exam.  Low suspicion for dissection of the vertebral or carotid artery as presentation atypical of etiology CT imaging is negative.  Low suspicion for meningitis as she has no meningeal sign present.  I doubt hypertensive emergency no evidence of organ damage present on exam without lab work, should her blood pressure is elevated but this is likely due to noncompliance with medication, she was given dose of her medication she states she was also taking it when she gets home.     Dispostion and problem list  After consideration of the diagnostic results and the patients response to treatment, I feel that the patent would benefit from discharge.  Headache-likely multifactorial, tension-like from the muscular pain as well as a slight migraine, will have her take over-the-counter pain medications, follow-up with neurosurgery for further evaluation. High blood  pressure-multifactorial likely elevated due to pain as well as not taking her blood pressure medications, could you take her blood pressure station upon arrival home.            Final Clinical Impression(s) / ED Diagnoses Final diagnoses:  Bad headache  Elevated blood pressure reading    Rx / DC Orders ED Discharge Orders     None         Marcello Fennel, PA-C 36/62/94 [id number]    Delora Fuel, MD 65/03/54 [phone]

## 2022-03-23 NOTE — Discharge Instructions (Addendum)
Headache-likely coming from your neck pain, I recommend over-the-counter pain medication as needed, please follow your primary care doctor for further assessment. High blood pressure-please start taking your blood pressure medications. Neck pain-please follow-up with neurosurgery for further evaluation  Come back to the emergency department if you develop chest pain, shortness of breath, severe abdominal pain, uncontrolled nausea, vomiting, diarrhea.

## 2022-04-17 ENCOUNTER — Ambulatory Visit: Payer: Medicaid Other | Admitting: Obstetrics

## 2022-04-23 ENCOUNTER — Ambulatory Visit: Payer: Medicaid Other | Admitting: Neurology

## 2022-05-29 ENCOUNTER — Ambulatory Visit: Payer: Medicaid Other | Admitting: Physical Therapy

## 2022-06-06 ENCOUNTER — Ambulatory Visit: Payer: Medicaid Other | Admitting: Obstetrics and Gynecology
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
## 2022-06-20 ENCOUNTER — Ambulatory Visit: Payer: Medicaid Other | Attending: Physician Assistant | Admitting: Physical Therapy

## 2022-06-20 ENCOUNTER — Other Ambulatory Visit: Payer: Self-pay

## 2022-06-20 ENCOUNTER — Encounter: Payer: Self-pay | Admitting: Physical Therapy

## 2022-06-20 DIAGNOSIS — R293 Abnormal posture: Secondary | ICD-10-CM | POA: Insufficient documentation

## 2022-06-20 DIAGNOSIS — M6281 Muscle weakness (generalized): Secondary | ICD-10-CM | POA: Diagnosis present

## 2022-06-20 DIAGNOSIS — M542 Cervicalgia: Secondary | ICD-10-CM | POA: Diagnosis present

## 2022-06-20 NOTE — Patient Instructions (Signed)
Access Code: [email or web address] URL: [email or web address] Date: 06/20/2022 Prepared by: Hilda Blades  Exercises - Supine Cervical Retraction with Towel  - 1 x daily - 2 sets - 10 reps - 5 seconds hold - Doorway Pec Stretch at 90 Degrees Abduction  - 1 x daily - 3 reps - 20 seconds hold - Standing Row with Anchored Resistance  - 1 x daily - 3 sets - 10 reps

## 2022-06-27 ENCOUNTER — Encounter: Payer: Self-pay | Admitting: Cardiology

## 2022-06-28 NOTE — Telephone Encounter (Signed)
From patient.

## 2022-07-04 ENCOUNTER — Ambulatory Visit: Payer: Medicaid Other | Admitting: Cardiology

## 2022-07-11 ENCOUNTER — Telehealth: Payer: Self-pay

## 2022-07-11 NOTE — Telephone Encounter (Signed)
Patient received a additional referral for her hip, Advised her to bring in to next appointment so it can be added to her chart.

## 2022-07-12 ENCOUNTER — Ambulatory Visit: Payer: Medicaid Other | Admitting: Cardiology

## 2022-07-18 ENCOUNTER — Encounter: Payer: Self-pay | Admitting: Family Medicine

## 2022-07-18 ENCOUNTER — Ambulatory Visit (INDEPENDENT_AMBULATORY_CARE_PROVIDER_SITE_OTHER): Payer: Medicaid Other | Admitting: Family Medicine

## 2022-07-18 ENCOUNTER — Other Ambulatory Visit (HOSPITAL_COMMUNITY)
Admission: RE | Admit: 2022-07-18 | Discharge: 2022-07-18 | Disposition: A | Discharge status: Home / Self Care | Payer: Medicaid Other | Source: Ambulatory Visit | Attending: Obstetrics | Admitting: Obstetrics

## 2022-07-18 VITALS — BP 141/88 | HR 65 | Ht 63.0 in | Wt 215.6 lb

## 2022-07-18 DIAGNOSIS — G40909 Epilepsy, unspecified, not intractable, without status epilepticus: Secondary | ICD-10-CM

## 2022-07-18 DIAGNOSIS — Z1159 Encounter for screening for other viral diseases: Secondary | ICD-10-CM

## 2022-07-18 DIAGNOSIS — N939 Abnormal uterine and vaginal bleeding, unspecified: Secondary | ICD-10-CM

## 2022-07-18 DIAGNOSIS — Z01419 Encounter for gynecological examination (general) (routine) without abnormal findings: Secondary | ICD-10-CM

## 2022-07-18 DIAGNOSIS — Z833 Family history of diabetes mellitus: Secondary | ICD-10-CM

## 2022-07-18 DIAGNOSIS — I10 Essential (primary) hypertension: Secondary | ICD-10-CM

## 2022-07-18 DIAGNOSIS — Z0000 Encounter for general adult medical examination without abnormal findings: Secondary | ICD-10-CM | POA: Insufficient documentation

## 2022-07-18 DIAGNOSIS — Z1339 Encounter for screening examination for other mental health and behavioral disorders: Secondary | ICD-10-CM | POA: Diagnosis not present

## 2022-07-18 NOTE — Progress Notes (Signed)
Pt presents for AEX.  Pt reports no periods for 15 months then 9 days with heavy bleeding that stopped yesterday.  Last PAP unknown  Last mammogram 01/2022 Decline STD testing

## 2022-07-18 NOTE — Progress Notes (Addendum)
ANNUAL EXAM Patient name: Clementina Alaimo MRN [id number]  Date of birth: December 24, 1970 Chief Complaint:   No chief complaint on file.  History of Present Illness:   Sarinity Autrey Mushtaq is a 52 y.o. G75P4000  female being seen today for a routine annual exam.  Current complaints:  Pt reports no periods for 15 months then 9 days with heavy bleeding that stopped yesterday.     Current Outpatient Medications:    albuterol (VENTOLIN HFA) 108 (90 Base) MCG/ACT inhaler, Inhale 2 puffs into the lungs every 4 (four) hours as needed for wheezing or shortness of breath., Disp: , Rfl:    cholecalciferol (VITAMIN D) 1000 units tablet, Take 1,000 Units by mouth daily., Disp: , Rfl:    levETIRAcetam (KEPPRA) 500 MG tablet, 1 tab(s) orally 2 times a day for 30 days, Disp: , Rfl:    No LMP recorded. Patient is perimenopausal.   Last pap unknown by the patient.  Last mammogram: 01/2022. Results were: normal. Family h/o breast cancer: no Last colonoscopy: 2015. Results were: unknown, need records released. Family h/o colorectal cancer: no     07/18/2022   10:23 AM  Depression screen PHQ 2/9  Decreased Interest 0  Down, Depressed, Hopeless 0  PHQ - 2 Score 0  Altered sleeping 0  Tired, decreased energy 0  Change in appetite 0  Feeling bad or failure about yourself  0  Trouble concentrating 0  Moving slowly or fidgety/restless 0  Suicidal thoughts 0  PHQ-9 Score 0        07/18/2022   10:23 AM  GAD 7 : Generalized Anxiety Score  Nervous, Anxious, on Edge 1  Control/stop worrying 0  Worry too much - different things 0  Trouble relaxing 1  Restless 0  Easily annoyed or irritable 0  Afraid - awful might happen 0  Total GAD 7 Score 2   Pertinent History Reviewed:  Reviewed past medical,surgical, social and family history.  Reviewed problem list, medications and allergies. Physical Assessment:   Vitals:   07/18/22 1016  BP: (!) 141/88  Pulse: 65  Weight: 215 lb 9.6 oz (97.8 kg)   Height: 5\' 3"  (1.6 m)  Body mass index is 38.19 kg/m.        Physical Examination:   General appearance - well appearing, and in no distress  Mental status - alert, oriented to person, place, and time  Psych:  She has a normal mood and affect  Skin - warm and dry, normal color, no suspicious lesions noted  Chest - effort normal, all lung fields clear to auscultation bilaterally  Heart - normal rate and regular rhythm  Neck:  midline trachea, no thyromegaly or nodules  Abdomen - soft, nontender, nondistended, no masses or organomegaly  Pelvic - VULVA: normal appearing vulva with no masses, tenderness or lesions  VAGINA: normal appearing vagina with normal color and discharge, no lesions  CERVIX: normal appearing cervix with dark bloody discharge. No lesions, no CMT  Thin prep pap is done w/ HR HPV cotesting  UTERUS: uterus is felt to be normal size, shape, consistency and nontender   ADNEXA: No adnexal masses or tenderness noted.  Extremities:  No swelling or varicosities noted  Chaperone present for exam  No results found for this or any previous visit (from the past 24 hour(s)).  Assessment & Plan:  1. Encounter for physical examination - Hepatitis C Antibody - Cytology - PAP( [place]) - Cervicovaginal ancillary only( [place])  2. Abnormal  uterine bleeding - US PELVIS TRANSVAGINAL NON-OB ([facility] ONLY); Future - Endometrial biopsy; Future - CBC - HgB A1c - Cytology - PAP( [place]) - Cervicovaginal ancillary only( [place]) - Surgical pathology( [place]/ [facility])  3. Hypertension, unspecified type Asymptomatic. No medications. Most recent CMP showed normal kidne33y function. Encouraged to follow up with PCP  4. Family history of diabetes mellitus (DM) - HgB A1c  5. Screening for viral disease - Hepatitis C Antibody  6. Seizure disorder (HCC) - Continue keppra  Labs/procedures today:  ENDOMETRIAL BIOPSY     The indications for endometrial biopsy  were reviewed.   Risks of the biopsy including cramping, bleeding, infection, uterine perforation, inadequate specimen and need for additional procedures  were discussed. The patient states she understands and agrees to undergo procedure today. Consent was obtained. Time out was performed. The patient is reasonably not pregnant. Chaperone was present during entire procedure. During the pelvic exam, the cervix was prepped with Betadine. A single-toothed tenaculum was placed on the anterior lip of the cervix to stabilize it. The 3 mm pipelle was introduced into the endometrial cavity without difficulty to a depth of 6cm, and a moderate amount of tissue was obtained and sent to pathology. The instruments were removed from the patient's vagina. Minimal bleeding from the cervix was noted. The patient tolerated the procedure well. Routine post-procedure instructions were given to the patient.    Mammogram: UTD Colonoscopy: 2015, need records  Orders Placed This Encounter  Procedures   Endometrial biopsy   US PELVIS TRANSVAGINAL NON-OB (TV ONLY)   HgB A1c   CBC   Hepatitis C Antibody    Meds: No orders of the defined types were placed in this encounter.   Follow-up: Return in about 2 weeks (around 08/01/2022) for abnormal uterine bleeding/results.  Ewa Hipp Autry-Lott, DO 07/21/2022 9:07 AM

## 2022-07-18 NOTE — Patient Instructions (Addendum)
Please follow up with your PCP/Cardiology for your blood pressure.

## 2022-07-19 LAB — HEMOGLOBIN A1C
Est. average glucose Bld gHb Est-mCnc: 120 mg/dL
Hgb A1c MFr Bld: 5.8 % — ABNORMAL HIGH (ref 4.8–5.6)

## 2022-07-19 LAB — HEPATITIS C ANTIBODY: Hep C Virus Ab: NONREACTIVE

## 2022-07-20 LAB — CERVICOVAGINAL ANCILLARY ONLY
Bacterial Vaginitis (gardnerella): NEGATIVE
Candida Glabrata: NEGATIVE
Candida Vaginitis: NEGATIVE
Chlamydia: NEGATIVE
Comment: NEGATIVE
Comment: NEGATIVE
Comment: NEGATIVE
Comment: NEGATIVE
Comment: NEGATIVE
Comment: NORMAL
Neisseria Gonorrhea: NEGATIVE
Trichomonas: NEGATIVE

## 2022-07-20 LAB — SURGICAL PATHOLOGY

## 2022-07-21 NOTE — Addendum Note (Signed)
Addended by: Alonna Buckler on: 07/21/2022 09:26 AM   Modules accepted: Orders

## 2022-07-24 ENCOUNTER — Ambulatory Visit: Payer: Medicaid Other

## 2022-07-24 LAB — CYTOLOGY - PAP
Comment: NEGATIVE
Diagnosis: NEGATIVE
Diagnosis: REACTIVE
High risk HPV: NEGATIVE

## 2022-07-26 ENCOUNTER — Ambulatory Visit: Payer: Medicaid Other | Admitting: Physical Therapy

## 2022-07-27 ENCOUNTER — Encounter: Payer: Self-pay | Admitting: Physical Therapy

## 2022-07-27 ENCOUNTER — Other Ambulatory Visit (HOSPITAL_COMMUNITY): Payer: Medicaid Other

## 2022-07-27 ENCOUNTER — Encounter: Payer: Self-pay | Admitting: Obstetrics and Gynecology

## 2022-07-30 ENCOUNTER — Ambulatory Visit: Payer: Medicaid Other | Admitting: Cardiology

## 2022-07-30 ENCOUNTER — Ambulatory Visit (HOSPITAL_COMMUNITY): Payer: Medicaid Other
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
## 2022-07-31 ENCOUNTER — Ambulatory Visit: Payer: Medicaid Other | Admitting: Physical Therapy

## 2022-08-01 ENCOUNTER — Ambulatory Visit: Payer: Medicaid Other | Admitting: Obstetrics and Gynecology

## 2022-08-02 ENCOUNTER — Ambulatory Visit: Payer: Medicaid Other | Admitting: Physical Therapy

## 2022-08-07 ENCOUNTER — Ambulatory Visit: Payer: Medicaid Other | Admitting: Physical Therapy

## 2022-08-14 ENCOUNTER — Ambulatory Visit: Payer: Medicaid Other | Admitting: Physical Therapy

## 2022-08-20 ENCOUNTER — Ambulatory Visit (HOSPITAL_COMMUNITY)
Admission: RE | Admit: 2022-08-20 | Discharge: 2022-08-20 | Disposition: A | Discharge status: Home / Self Care | Payer: Medicaid Other | Source: Ambulatory Visit | Attending: Obstetrics | Admitting: Obstetrics

## 2022-08-20 DIAGNOSIS — N939 Abnormal uterine and vaginal bleeding, unspecified: Secondary | ICD-10-CM | POA: Diagnosis present

## 2022-08-27 ENCOUNTER — Ambulatory Visit: Payer: Medicaid Other | Admitting: Obstetrics and Gynecology

## 2022-08-27 ENCOUNTER — Encounter: Payer: Medicaid Other | Admitting: Physical Therapy
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
## 2022-09-10 ENCOUNTER — Other Ambulatory Visit: Payer: Self-pay

## 2022-09-10 ENCOUNTER — Ambulatory Visit: Payer: Medicaid Other | Attending: Physician Assistant | Admitting: Physical Therapy

## 2022-09-10 ENCOUNTER — Encounter: Payer: Self-pay | Admitting: Physical Therapy

## 2022-09-10 DIAGNOSIS — M542 Cervicalgia: Secondary | ICD-10-CM | POA: Diagnosis present

## 2022-09-10 DIAGNOSIS — M6281 Muscle weakness (generalized): Secondary | ICD-10-CM | POA: Insufficient documentation

## 2022-09-10 DIAGNOSIS — R293 Abnormal posture: Secondary | ICD-10-CM | POA: Diagnosis present

## 2022-09-10 DIAGNOSIS — M25552 Pain in left hip: Secondary | ICD-10-CM | POA: Diagnosis present

## 2022-09-10 NOTE — Patient Instructions (Signed)
Access Code: [email or web address] URL: [email or web address] Date: 09/10/2022 Prepared by: Rosana Hoes  Exercises - Supine Cervical Retraction with Towel  - 1 x daily - 2 sets - 10 reps - 5 seconds hold - Doorway Pec Stretch at 90 Degrees Abduction  - 1 x daily - 3 reps - 20 seconds hold - Standing Row with Anchored Resistance  - 1 x daily - 3 sets - 10 reps - Modified Thomas Stretch  - 1 x daily - 3 reps - 30-60 seconds hold - Bridge  - 1 x daily - 3 sets - 10 reps - Clam with Resistance  - 1 x daily - 3 sets - 10 reps

## 2022-09-17 ENCOUNTER — Ambulatory Visit: Payer: Medicaid Other | Attending: Internal Medicine | Admitting: Internal Medicine

## 2022-09-17 ENCOUNTER — Ambulatory Visit: Payer: Medicaid Other | Admitting: Physical Therapy

## 2022-09-17 VITALS — BP 132/82 | HR 61 | Ht 63.0 in | Wt 213.5 lb

## 2022-09-17 DIAGNOSIS — Z79899 Other long term (current) drug therapy: Secondary | ICD-10-CM | POA: Diagnosis not present

## 2022-09-17 DIAGNOSIS — I998 Other disorder of circulatory system: Secondary | ICD-10-CM

## 2022-09-17 MED ORDER — AMLODIPINE BESYLATE 5 MG PO TABS: 5.0000 mg | ORAL_TABLET | Freq: Every day | ORAL | 3 refills | Status: DC

## 2022-09-17 MED ORDER — LOSARTAN POTASSIUM 25 MG PO TABS: 25.0000 mg | ORAL_TABLET | Freq: Every day | ORAL | 3 refills | Status: DC

## 2022-09-17 NOTE — Progress Notes (Signed)
Cardiology Office Note:    Date:  09/17/2022   ID:  Suzanne Velazquez, DOB November 02, 1970, MRN [id number]  PCP:  Norm Salt, PA   [facility] Providers Cardiologist:  None     Referring MD: Jackie Plum, MD   No chief complaint on file. Blood pressure fluctuations  History of Present Illness:    Suzanne Velazquez is a 52 y.o. female with a hx of COPD, obesity, who was referred from her primary provider. She notes during sexual intercourse she feels pressure in the base of her neck and has to stop. She then checks her BP and it is high. She is concerned and thinks her blood pressure is causing the issue. She's had a an MRI of her cervical spine showing multiple small central disc protrusions superimposed on mild congenital spinal canal narrowing.    Past Surgical History:  Procedure Laterality Date   ANTERIOR LAT LUMBAR FUSION N/A 05/08/2016   Procedure: Lumbar three-four  Anterolateral lumbar interbody fusion with plate;  Surgeon: Loura Halt Ditty, MD;  Location: [facility] OR;  Service: Neurosurgery;  Laterality: N/A;   BACK SURGERY     SHOULDER SURGERY Right    rotator cuff   TONSILLECTOMY     TUBAL LIGATION      Current Medications: Current Meds  Medication Sig   albuterol (VENTOLIN HFA) 108 (90 Base) MCG/ACT inhaler Inhale 2 puffs into the lungs every 4 (four) hours as needed for wheezing or shortness of breath.   carvedilol (COREG) 6.25 MG tablet Take 6.25 mg by mouth 2 (two) times daily.   cholecalciferol (VITAMIN D) 1000 units tablet Take 1,000 Units by mouth daily.   ibuprofen (ADVIL) 800 MG tablet Take 800 mg by mouth 3 (three) times daily as needed for mild pain or moderate pain.   levETIRAcetam (KEPPRA) 500 MG tablet 1 tab(s) orally 2 times a day for 30 days   mupirocin cream (BACTROBAN) 2 % Apply 1 Application topically daily.     Allergies:   Magnesium sulfate, Sulfa antibiotics, and Codeine   Social History   Socioeconomic History    Marital status: Legally Separated    Spouse name: Not on file   Number of children: 4   Years of education: Not on file   Highest education level: Not on file  Occupational History   Occupation: UNEMPLOYED    Employer: UNEMPLOYED  Tobacco Use   Smoking status: Never   Smokeless tobacco: Never  Vaping Use   Vaping Use: Never used  Substance and Sexual Activity   Alcohol use: No    Alcohol/week: 0.0 standard drinks of alcohol   Drug use: No   Sexual activity: Yes  Other Topics Concern   Not on file  Social History Narrative   Lives with dad and son in a one story home.  Does not work.  Education: associate's degree.   Social Determinants of Health   Financial Resource Strain: Not on file  Food Insecurity: Not on file  Transportation Needs: Not on file  Physical Activity: Not on file  Stress: Not on file  Social Connections: Not on file     Family History: The patient's family history includes Heart disease in her daughter and father. There is no history of Breast cancer. Father has a stent. Mother has HTN.  ROS:   Please see the history of present illness.     All other systems reviewed and are negative.  EKGs/Labs/Other Studies Reviewed:    The following studies  were reviewed today:   TTE 10/23/2015 LVEF 60-65% Normal echo  SPECT 10/23/2015 normal  EKG Interpretation Date/Time:  Monday September 17 2022 10:51:10 EDT Ventricular Rate:  61 PR Interval:  168 QRS Duration:  82 QT Interval:  406 QTC Calculation: 408 R Axis:   49  Text Interpretation: Normal sinus rhythm Normal ECG When compared with ECG of 23-Mar-2022 04:52, No significant change was found Confirmed by Carolan Clines ([id number]) on 09/17/2022 11:06:33 AM        Recent Labs: 03/23/2022: BUN 9; Creatinine, Ser 0.82; Hemoglobin 12.4; Platelets 288; Potassium 3.7; Sodium 140   Recent Lipid Panel No results found for: "CHOL", "TRIG", "HDL", "CHOLHDL", "VLDL", "LDLCALC", "LDLDIRECT"   Risk  Assessment/Calculations:     Physical Exam:    VS:  Vitals:   09/17/22 1058  BP: 132/82  Pulse: 61  SpO2: 98%     Wt Readings from Last 3 Encounters:  07/18/22 215 lb 9.6 oz (97.8 kg)  03/22/22 215 lb (97.5 kg)  11/17/20 221 lb 6.4 oz (100.4 kg)     GEN:  Well nourished, well developed in no acute distress HEENT: Normal NECK: No JVD; No carotid bruits CARDIAC: RRR, no murmurs, rubs, gallops RESPIRATORY:  Clear to auscultation without rales, wheezing or rhonchi  ABDOMEN: Soft, non-tender, non-distended MUSCULOSKELETAL:  No edema; No deformity  SKIN: Warm and dry NEUROLOGIC:  Alert and oriented x 3 PSYCHIATRIC:  Normal affect   ASSESSMENT:    HTN - was on diuretic in the past and had cramps, cough with lisnopril, headaches with losartan.  - start amlodipine 5 mg daily - continue coreg 6.25 mg BID  - can uptitrate the coreg to 25 mg BID/ norvasc to 10 mg daily for BP not at goal >130/80 mmHg - we discussed that pain from her cervical disk dx  is contributing to her elevated blood pressures  She is planned for phentermine. She is acceptable cardiac risk for this medication; will note on her AVS  PLAN:    In order of problems listed above:  Norvasc 5 mg daily Follow up PRN           Medication Adjustments/Labs and Tests Ordered: Current medicines are reviewed at length with the patient today.  Concerns regarding medicines are outlined above.  Orders Placed This Encounter  Procedures   EKG 12-Lead   No orders of the defined types were placed in this encounter.   There are no Patient Instructions on file for this visit.   Signed, Maisie Fus, MD  09/17/2022 10:58 AM    [facility]

## 2022-09-17 NOTE — Patient Instructions (Addendum)
Medication Instructions:  Your physician has recommended you make the following change in your medication:  START: Amlodipine (Norvasc) 5 mg once daily *If you need a refill on your cardiac medications before your next appointment, please call your pharmacy*   Lab Work: None   Testing/Procedures: None   Follow-Up: At [facility], you and your health needs are our priority.  As part of our continuing mission to provide you with exceptional heart care, we have created designated Provider Care Teams.  These Care Teams include your primary Cardiologist (physician) and Advanced Practice Providers (APPs -  Physician Assistants and Nurse Practitioners) who all work together to provide you with the care you need, when you need it.    Your next appointment:    As needed  Provider:   Maisie Fus, MD    Other Instructions Suzanne Velazquez is cleared to start Phentermine from a cardiovascular standpoint.

## 2022-10-01 ENCOUNTER — Ambulatory Visit: Payer: Medicaid Other | Admitting: Obstetrics & Gynecology
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
## 2022-10-18 ENCOUNTER — Other Ambulatory Visit: Payer: Self-pay

## 2022-10-18 ENCOUNTER — Ambulatory Visit: Payer: Medicaid Other | Attending: Physician Assistant | Admitting: Physical Therapy

## 2022-10-18 ENCOUNTER — Encounter: Payer: Self-pay | Admitting: Physical Therapy

## 2022-10-18 DIAGNOSIS — R293 Abnormal posture: Secondary | ICD-10-CM | POA: Diagnosis present

## 2022-10-18 DIAGNOSIS — M6281 Muscle weakness (generalized): Secondary | ICD-10-CM | POA: Diagnosis present

## 2022-10-18 DIAGNOSIS — M25552 Pain in left hip: Secondary | ICD-10-CM | POA: Insufficient documentation

## 2022-10-18 DIAGNOSIS — M542 Cervicalgia: Secondary | ICD-10-CM | POA: Insufficient documentation

## 2022-10-18 NOTE — Patient Instructions (Signed)
Access Code: [email or web address] URL: [email or web address] Date: 10/18/2022 Prepared by: Rosana Hoes  Exercises - Supine Cervical Retraction with Towel  - 1 x daily - 2 sets - 10 reps - 5 seconds hold - Sidelying Thoracic Lumbar Rotation  - 1 x daily - 10 reps - 5 seconds hold - Seated Cervical Sidebending Stretch  - 1 x daily - 3 reps - 30 seconds hold - Doorway Pec Stretch at 90 Degrees Abduction  - 1 x daily - 3 reps - 20 seconds hold - Standing Row with Anchored Resistance  - 1 x daily - 3 sets - 10 reps - Modified Thomas Stretch  - 1 x daily - 3 reps - 30-60 seconds hold - Bridge  - 1 x daily - 3 sets - 10 reps - Clam with Resistance  - 1 x daily - 3 sets - 10 reps

## 2022-10-19 ENCOUNTER — Ambulatory Visit: Payer: Medicaid Other

## 2022-10-23 ENCOUNTER — Ambulatory Visit: Payer: Medicaid Other

## 2022-10-25 ENCOUNTER — Ambulatory Visit: Payer: Medicaid Other | Admitting: Physical Therapy

## 2022-10-26 ENCOUNTER — Encounter: Payer: Self-pay | Admitting: Neurology

## 2022-10-26 ENCOUNTER — Ambulatory Visit: Payer: Medicaid Other | Admitting: Neurology

## 2022-11-07 ENCOUNTER — Encounter: Payer: Self-pay | Admitting: Obstetrics and Gynecology

## 2022-11-15 ENCOUNTER — Ambulatory Visit: Payer: Medicaid Other | Admitting: Obstetrics and Gynecology

## 2022-11-19 ENCOUNTER — Encounter: Payer: Self-pay | Admitting: Obstetrics and Gynecology

## 2022-11-21 ENCOUNTER — Ambulatory Visit: Payer: Medicaid Other | Admitting: Obstetrics and Gynecology

## 2022-11-23 ENCOUNTER — Ambulatory Visit: Payer: Medicaid Other | Admitting: Physical Therapy

## 2022-11-27 ENCOUNTER — Ambulatory Visit: Payer: Medicaid Other
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
## 2023-01-04 ENCOUNTER — Other Ambulatory Visit (HOSPITAL_COMMUNITY): Payer: Self-pay

## 2023-01-04 MED ORDER — SEMAGLUTIDE-WEIGHT MANAGEMENT 0.25 MG/0.5ML ~~LOC~~ SOAJ
0.2500 mg | SUBCUTANEOUS | 0 refills | Status: DC
  Filled 2023-01-04: qty 2, 28d supply, fill #0
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
## 2023-01-08 ENCOUNTER — Ambulatory Visit: Payer: Medicaid Other | Attending: Physician Assistant | Admitting: Cardiology

## 2023-01-08 ENCOUNTER — Encounter: Payer: Self-pay | Admitting: Cardiology

## 2023-01-08 VITALS — BP 122/78 | HR 74 | Ht 63.0 in | Wt 211.2 lb

## 2023-01-08 DIAGNOSIS — Z79899 Other long term (current) drug therapy: Secondary | ICD-10-CM | POA: Diagnosis not present

## 2023-01-08 DIAGNOSIS — R0602 Shortness of breath: Secondary | ICD-10-CM

## 2023-01-08 DIAGNOSIS — R072 Precordial pain: Secondary | ICD-10-CM | POA: Diagnosis not present

## 2023-01-08 DIAGNOSIS — I998 Other disorder of circulatory system: Secondary | ICD-10-CM | POA: Diagnosis not present

## 2023-01-08 DIAGNOSIS — R600 Localized edema: Secondary | ICD-10-CM

## 2023-01-08 DIAGNOSIS — E785 Hyperlipidemia, unspecified: Secondary | ICD-10-CM

## 2023-01-08 MED ORDER — METOPROLOL TARTRATE 100 MG PO TABS: 100.0000 mg | ORAL_TABLET | ORAL | 0 refills | Status: DC

## 2023-01-08 NOTE — Patient Instructions (Addendum)
Medication Instructions:  No Changes *If you need a refill on your cardiac medications before your next appointment, please call your pharmacy*   Lab Work: Today: Bmet If you have labs (blood work) drawn today and your tests are completely normal, you will receive your results only by: MyChart Message (if you have MyChart) OR A paper copy in the mail If you have any lab test that is abnormal or we need to change your treatment, we will call you to review the results.   Testing/Procedures:  Your physician has requested that you have an echocardiogram. Echocardiography is a painless test that uses sound waves to create images of your heart. It provides your doctor with information about the size and shape of your heart and how well your heart's chambers and valves are working. This procedure takes approximately one hour. There are no restrictions for this procedure. Please do NOT wear cologne, perfume, aftershave, or lotions (deodorant is allowed). Please arrive 15 minutes prior to your appointment time.      Your cardiac CT will be scheduled at one of the below locations:   [facility] [address] [place], [place] [address] [phone]  OR  [facility] [address] [place], [place] [address] [phone]  OR   [facility] [address] [place], [place] [address] [phone]  If scheduled at [facility], please arrive at the [facility] and Children's Entrance (Entrance C2) of [facility] 30 minutes prior to test start time. You can use the FREE valet parking offered at entrance C (encouraged to control the heart rate for the test)  Proceed to the [facility] Radiology Department (first floor) to check-in and test prep.  All radiology patients and guests should use entrance C2 at [facility], accessed from [facility], even though the hospital's  physical address listed is [address]    If scheduled at [facility] or [facility], please arrive 15 mins early for check-in and test prep.  There is spacious parking and easy access to the radiology department from the [facility] Heart and Vascular entrance. Please enter here and check-in with the desk attendant.   Please follow these instructions carefully (unless otherwise directed):  An IV will be required for this test and Nitroglycerin will be given.  Hold all erectile dysfunction medications at least 3 days (72 hrs) prior to test. (Ie viagra, cialis, sildenafil, tadalafil, etc)   On the Night Before the Test: Be sure to Drink plenty of water. Do not consume any caffeinated/decaffeinated beverages or chocolate 12 hours prior to your test. Do not take any antihistamines 12 hours prior to your test.  On the Day of the Test: Drink plenty of water until 1 hour prior to the test. Do not eat any food 1 hour prior to test. You may take your regular medications prior to the test.  Take metoprolol (Lopressor) two hours prior to test. If you take Furosemide/Hydrochlorothiazide/Spironolactone, please HOLD on the morning of the test. FEMALES- please wear underwire-free bra if available, avoid dresses & tight clothing       After the Test: Drink plenty of water. After receiving IV contrast, you may experience a mild flushed feeling. This is normal. On occasion, you may experience a mild rash up to 24 hours after the test. This is not dangerous. If this occurs, you can take Benadryl 25 mg and increase your  fluid intake. If you experience trouble breathing, this can be serious. If it is severe call 911 IMMEDIATELY. If it is mild, please call our office. If you take any of these medications: Glipizide/Metformin, Avandament, Glucavance, please do not take 48 hours after completing test unless otherwise instructed.  We will call to  schedule your test 2-4 weeks out understanding that some insurance companies will need an authorization prior to the service being performed.   For more information and frequently asked questions, please visit our website : [email or web address]  For non-scheduling related questions, please contact the cardiac imaging nurse navigator should you have any questions/concerns: Cardiac Imaging Nurse Navigators Direct Office Dial: [phone]   For scheduling needs, including cancellations and rescheduling, please call [place], [phone].    Follow-Up: At [facility], you and your health needs are our priority.  As part of our continuing mission to provide you with exceptional heart care, we have created designated Provider Care Teams.  These Care Teams include your primary Cardiologist (physician) and Advanced Practice Providers (APPs -  Physician Assistants and Nurse Practitioners) who all work together to provide you with the care you need, when you need it.  We recommend signing up for the patient portal called "MyChart".  Sign up information is provided on this After Visit Summary.  MyChart is used to connect with patients for Virtual Visits (Telemedicine).  Patients are able to view lab/test results, encounter notes, upcoming appointments, etc.  Non-urgent messages can be sent to your provider as well.   To learn more about what you can do with MyChart, go to [email or web address].    Your next appointment:   6-8 weeks with Reather Littler, NP 3 Weeks with Reather Littler, NP   Other Instructions Keep blood pressure log at home for the next 2 weeks if blood pressure is below 130/90 you can cancel the 3 week appointment, please bring in Blood pressure cuff to next appointment.

## 2023-01-09 LAB — BASIC METABOLIC PANEL
BUN/Creatinine Ratio: 12 (ref 9–23)
BUN: 11 mg/dL (ref 6–24)
CO2: 27 mmol/L (ref 20–29)
Calcium: 9.9 mg/dL (ref 8.7–10.2)
Chloride: 103 mmol/L (ref 96–106)
Creatinine, Ser: 0.93 mg/dL (ref 0.57–1.00)
Glucose: 86 mg/dL (ref 70–99)
Potassium: 4.5 mmol/L (ref 3.5–5.2)
Sodium: 141 mmol/L (ref 134–144)
eGFR: 74 mL/min/{1.73_m2} (ref >59)

## 2023-01-18 ENCOUNTER — Other Ambulatory Visit (HOSPITAL_COMMUNITY): Payer: Self-pay

## 2023-01-18 ENCOUNTER — Other Ambulatory Visit: Payer: Self-pay

## 2023-01-18 MED ORDER — WEGOVY 0.5 MG/0.5ML ~~LOC~~ SOAJ
0.5000 mL | SUBCUTANEOUS | 0 refills | Status: DC
  Filled 2023-01-18: qty 2, 28d supply, fill #0

## 2023-01-29 ENCOUNTER — Ambulatory Visit: Payer: Medicaid Other | Admitting: Cardiology
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
## 2023-02-01 ENCOUNTER — Ambulatory Visit: Payer: Medicaid Other | Admitting: Cardiology

## 2023-02-06 ENCOUNTER — Ambulatory Visit (HOSPITAL_COMMUNITY): Payer: Medicaid Other
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
## 2023-02-19 ENCOUNTER — Ambulatory Visit: Payer: Medicaid Other | Admitting: Cardiology

## 2023-02-26 ENCOUNTER — Other Ambulatory Visit (HOSPITAL_COMMUNITY): Payer: Self-pay

## 2023-02-26 ENCOUNTER — Encounter (HOSPITAL_COMMUNITY): Payer: Self-pay

## 2023-02-26 MED ORDER — WEGOVY 1 MG/0.5ML ~~LOC~~ SOAJ
1.0000 mg | SUBCUTANEOUS | 0 refills | Status: DC
  Filled 2023-02-26: qty 2, 28d supply, fill #0

## 2023-03-19 ENCOUNTER — Ambulatory Visit (HOSPITAL_COMMUNITY): Payer: Medicaid Other

## 2023-03-26 ENCOUNTER — Other Ambulatory Visit (HOSPITAL_COMMUNITY): Payer: Self-pay

## 2023-03-26 MED ORDER — WEGOVY 1 MG/0.5ML ~~LOC~~ SOAJ
1.0000 mg | SUBCUTANEOUS | 0 refills | Status: AC
  Filled 2023-03-26: qty 2, 28d supply, fill #0

## 2023-03-29 ENCOUNTER — Ambulatory Visit: Payer: Medicaid Other | Admitting: Cardiology

## 2023-04-15 ENCOUNTER — Other Ambulatory Visit (HOSPITAL_COMMUNITY): Payer: Medicaid Other

## 2023-04-22 ENCOUNTER — Encounter: Payer: Self-pay | Admitting: Gastroenterology

## 2023-05-02 ENCOUNTER — Other Ambulatory Visit (HOSPITAL_COMMUNITY): Payer: Self-pay

## 2023-05-14 ENCOUNTER — Ambulatory Visit: Payer: Medicaid Other | Admitting: Neurology

## 2023-05-14 ENCOUNTER — Encounter: Payer: Self-pay | Admitting: Neurology

## 2023-05-14 VITALS — BP 131/94 | HR 88 | Ht 63.0 in | Wt 187.8 lb

## 2023-05-14 DIAGNOSIS — G8929 Other chronic pain: Secondary | ICD-10-CM | POA: Diagnosis not present

## 2023-05-14 DIAGNOSIS — G40209 Localization-related (focal) (partial) symptomatic epilepsy and epileptic syndromes with complex partial seizures, not intractable, without status epilepticus: Secondary | ICD-10-CM

## 2023-05-14 DIAGNOSIS — M542 Cervicalgia: Secondary | ICD-10-CM | POA: Diagnosis not present

## 2023-05-14 DIAGNOSIS — G4486 Cervicogenic headache: Secondary | ICD-10-CM | POA: Diagnosis not present

## 2023-05-14 DIAGNOSIS — R519 Headache, unspecified: Secondary | ICD-10-CM

## 2023-05-14 MED ORDER — ZONISAMIDE 100 MG PO CAPS: ORAL_CAPSULE | ORAL | 3 refills | Status: DC
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
## 2023-05-20 ENCOUNTER — Ambulatory Visit (AMBULATORY_SURGERY_CENTER): Payer: Medicaid Other

## 2023-05-20 VITALS — Ht 63.0 in | Wt 184.0 lb

## 2023-05-20 DIAGNOSIS — Z1211 Encounter for screening for malignant neoplasm of colon: Secondary | ICD-10-CM

## 2023-05-20 NOTE — Progress Notes (Signed)
 No egg or soy allergy known to patient  No issues known to pt with past sedation with any surgeries or procedures Patient denies ever being told they had issues or difficulty with intubation  No FH of Malignant Hyperthermia Pt is not on diet pills Pt is not on  home 02  OSA not using CPAP  Pt is not on blood thinners  Pt denies issues with constipation  No A fib or A flutter Have any cardiac testing pending--no  LOA: independent  Prep: split dose miralax   Patient's chart reviewed by Cathlyn Parsons CNRA prior to previsit and patient appropriate for the [facility].  Previsit completed and red dot placed by patient's name on their procedure day (on provider's schedule).     PV completed with patient. Prep instructions sent via mychart and home address.

## 2023-05-23 ENCOUNTER — Encounter: Payer: Self-pay | Admitting: Physical Medicine & Rehabilitation

## 2023-06-04 ENCOUNTER — Other Ambulatory Visit (HOSPITAL_COMMUNITY): Payer: Self-pay

## 2023-06-04 MED ORDER — SEMAGLUTIDE-WEIGHT MANAGEMENT 1.7 MG/0.75ML ~~LOC~~ SOAJ
1.7000 mg | SUBCUTANEOUS | 2 refills | Status: DC
Start: 2023-04-25 — End: 2023-07-30
  Filled 2023-06-04: qty 3, 28d supply, fill #0
  Filled 2023-07-01: qty 3, 28d supply, fill #1

## 2023-06-05 ENCOUNTER — Encounter: Payer: Self-pay | Admitting: Gastroenterology

## 2023-06-07 ENCOUNTER — Encounter: Payer: Medicaid Other | Admitting: Gastroenterology

## 2023-06-20 ENCOUNTER — Encounter: Admitting: Physical Medicine & Rehabilitation

## 2023-07-17 ENCOUNTER — Other Ambulatory Visit: Payer: Self-pay | Admitting: Physician Assistant

## 2023-07-17 DIAGNOSIS — Z0000 Encounter for general adult medical examination without abnormal findings: Secondary | ICD-10-CM

## 2023-07-25 ENCOUNTER — Encounter (HOSPITAL_COMMUNITY): Payer: Self-pay

## 2023-07-29 ENCOUNTER — Encounter: Admitting: Physical Medicine & Rehabilitation

## 2023-07-30 ENCOUNTER — Other Ambulatory Visit (HOSPITAL_COMMUNITY): Payer: Self-pay

## 2023-07-30 MED ORDER — WEGOVY 1.7 MG/0.75ML ~~LOC~~ SOAJ
1.7000 mg | SUBCUTANEOUS | 2 refills | Status: AC
Start: 2023-07-30 — End: ?
  Filled 2023-07-30: qty 3, 28d supply, fill #0

## 2023-08-01 ENCOUNTER — Ambulatory Visit
Admission: RE | Admit: 2023-08-01 | Discharge: 2023-08-01 | Disposition: A | Discharge status: Home / Self Care | Source: Ambulatory Visit | Attending: Physician Assistant | Admitting: Physician Assistant

## 2023-08-01 DIAGNOSIS — Z0000 Encounter for general adult medical examination without abnormal findings: Secondary | ICD-10-CM

## 2023-08-09 ENCOUNTER — Encounter: Payer: Self-pay | Admitting: Neurology

## 2023-08-26 ENCOUNTER — Other Ambulatory Visit (HOSPITAL_COMMUNITY): Payer: Self-pay

## 2023-08-26 MED ORDER — WEGOVY 1.7 MG/0.75ML ~~LOC~~ SOAJ
1.7000 mg | SUBCUTANEOUS | 2 refills | Status: DC
  Filled 2023-08-26: qty 3, 28d supply, fill #0
  Filled 2023-10-07: qty 3, 28d supply, fill #1

## 2023-09-17 ENCOUNTER — Other Ambulatory Visit: Payer: Self-pay

## 2023-09-19 ENCOUNTER — Encounter: Admitting: Physical Medicine & Rehabilitation

## 2023-10-07 ENCOUNTER — Encounter: Payer: Self-pay | Admitting: Pharmacist

## 2023-10-07 ENCOUNTER — Other Ambulatory Visit (HOSPITAL_COMMUNITY): Payer: Self-pay

## 2023-10-07 ENCOUNTER — Other Ambulatory Visit: Payer: Self-pay

## 2023-11-04 ENCOUNTER — Other Ambulatory Visit: Payer: Self-pay | Admitting: Cardiology
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
## 2023-11-04 NOTE — Telephone Encounter (Signed)
 Error

## 2023-11-05 ENCOUNTER — Ambulatory Visit

## 2023-11-05 ENCOUNTER — Encounter: Payer: Self-pay | Admitting: Internal Medicine

## 2023-11-05 ENCOUNTER — Ambulatory Visit: Attending: Internal Medicine | Admitting: Internal Medicine

## 2023-11-05 VITALS — BP 127/79 | HR 79 | Resp 16 | Ht 63.5 in | Wt 162.8 lb

## 2023-11-05 DIAGNOSIS — Z8739 Personal history of other diseases of the musculoskeletal system and connective tissue: Secondary | ICD-10-CM

## 2023-11-05 DIAGNOSIS — R899 Unspecified abnormal finding in specimens from other organs, systems and tissues: Secondary | ICD-10-CM | POA: Diagnosis present

## 2023-11-05 DIAGNOSIS — M79641 Pain in right hand: Secondary | ICD-10-CM | POA: Diagnosis present

## 2023-11-05 DIAGNOSIS — M79642 Pain in left hand: Secondary | ICD-10-CM

## 2023-11-05 DIAGNOSIS — H18603 Keratoconus, unspecified, bilateral: Secondary | ICD-10-CM

## 2023-11-05 DIAGNOSIS — R768 Other specified abnormal immunological findings in serum: Secondary | ICD-10-CM | POA: Diagnosis present

## 2023-11-05 DIAGNOSIS — M797 Fibromyalgia: Secondary | ICD-10-CM | POA: Diagnosis present

## 2023-11-07 LAB — IGG, IGA, IGM
IgG (Immunoglobin G), Serum: 1179 mg/dL (ref 600–1640)
IgM, Serum: 122 mg/dL (ref 50–300)
Immunoglobulin A: 652 mg/dL — ABNORMAL HIGH (ref 47–310)

## 2023-11-07 LAB — SEDIMENTATION RATE: Sed Rate: 36 mm/h — ABNORMAL HIGH (ref 0–30)

## 2023-11-07 LAB — ANA,IFA RA DIAG PNL W/RFLX TIT/PATN
Anti Nuclear Antibody (ANA): NEGATIVE
Cyclic Citrullin Peptide Ab: <16 U
Rheumatoid fact SerPl-aCnc: 10 [IU]/mL (ref <14)

## 2023-11-07 LAB — C-REACTIVE PROTEIN: CRP: 3 mg/L (ref <8.0)

## 2023-11-07 LAB — C3 AND C4
C3 Complement: 163 mg/dL (ref 83–193)
C4 Complement: 32 mg/dL (ref 15–57)

## 2023-11-08 ENCOUNTER — Other Ambulatory Visit (HOSPITAL_COMMUNITY): Payer: Self-pay

## 2023-11-08 MED ORDER — WEGOVY 2.4 MG/0.75ML ~~LOC~~ SOAJ
2.4000 mg | SUBCUTANEOUS | 0 refills | Status: DC
  Filled 2023-11-08: qty 9, 84d supply, fill #0

## 2023-11-15 ENCOUNTER — Other Ambulatory Visit (HOSPITAL_COMMUNITY): Payer: Self-pay

## 2023-11-25 ENCOUNTER — Encounter: Admitting: Physical Medicine & Rehabilitation

## 2023-11-25 ENCOUNTER — Encounter: Payer: Self-pay | Admitting: Physical Medicine & Rehabilitation

## 2023-12-18 ENCOUNTER — Ambulatory Visit: Payer: Medicaid Other | Admitting: Neurology

## 2023-12-30 ENCOUNTER — Encounter: Attending: Physical Medicine & Rehabilitation | Admitting: Physical Medicine & Rehabilitation

## 2023-12-30 ENCOUNTER — Encounter: Payer: Self-pay | Admitting: Physical Medicine & Rehabilitation

## 2024-01-02 ENCOUNTER — Encounter: Payer: Self-pay | Admitting: Neurology

## 2024-01-03 MED ORDER — ZONISAMIDE 100 MG PO CAPS: ORAL_CAPSULE | ORAL | 3 refills | Status: AC

## 2024-02-05 ENCOUNTER — Ambulatory Visit: Admitting: Internal Medicine

## 2024-02-05 ENCOUNTER — Encounter: Payer: Self-pay | Admitting: Neurology
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
## 2024-03-13 ENCOUNTER — Other Ambulatory Visit (HOSPITAL_COMMUNITY): Payer: Self-pay

## 2024-03-16 ENCOUNTER — Other Ambulatory Visit (HOSPITAL_COMMUNITY): Payer: Self-pay

## 2024-03-16 MED ORDER — SEMAGLUTIDE-WEIGHT MANAGEMENT 2.4 MG/0.75ML ~~LOC~~ SOAJ
2.4000 mg | SUBCUTANEOUS | 0 refills | Status: AC
  Filled 2024-03-16: qty 9, 84d supply, fill #0
  Filled 2024-03-26: qty 3, 28d supply, fill #0
  Filled 2024-04-20: qty 9, 84d supply, fill #0

## 2024-03-26 ENCOUNTER — Other Ambulatory Visit: Payer: Self-pay

## 2024-04-20 ENCOUNTER — Other Ambulatory Visit (HOSPITAL_COMMUNITY): Payer: Self-pay

## 2024-04-22 ENCOUNTER — Other Ambulatory Visit (HOSPITAL_COMMUNITY): Payer: Self-pay

## 2024-07-17 ENCOUNTER — Ambulatory Visit: Admitting: Neurology
